# Patient Record
Sex: Female | Born: 1995 | Hispanic: Yes | Marital: Married | Smoking: Never smoker
Health system: Southern US, Community
[De-identification: ages and names within clinical notes are randomized; demographics above are authoritative.]

## PROBLEM LIST (undated history)

## (undated) ENCOUNTER — Inpatient Hospital Stay: Payer: Self-pay

## (undated) DIAGNOSIS — Z8759 Personal history of other complications of pregnancy, childbirth and the puerperium: Secondary | ICD-10-CM

## (undated) DIAGNOSIS — F419 Anxiety disorder, unspecified: Secondary | ICD-10-CM

## (undated) DIAGNOSIS — K625 Hemorrhage of anus and rectum: Secondary | ICD-10-CM

## (undated) DIAGNOSIS — Z87442 Personal history of urinary calculi: Secondary | ICD-10-CM

## (undated) HISTORY — DX: Hemorrhage of anus and rectum: K62.5

## (undated) HISTORY — DX: Personal history of urinary calculi: Z87.442

## (undated) HISTORY — DX: Personal history of urinary calculi: Z87.59

## (undated) HISTORY — DX: Anxiety disorder, unspecified: F41.9

## (undated) HISTORY — PX: NO PAST SURGERIES: SHX2092

---
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
## 2014-02-18 ENCOUNTER — Observation Stay: Payer: Self-pay | Admitting: Obstetrics and Gynecology

## 2014-03-09 ENCOUNTER — Inpatient Hospital Stay: Payer: Self-pay | Admitting: Obstetrics and Gynecology

## 2014-03-10 LAB — CBC WITH DIFFERENTIAL/PLATELET
Basophil #: 0 10*3/uL (ref 0.0–0.1)
Basophil %: 0.4 %
Eosinophil #: 0.1 10*3/uL (ref 0.0–0.7)
Eosinophil %: 0.6 %
HCT: 25.5 % — AB (ref 35.0–47.0)
HGB: 8.4 g/dL — ABNORMAL LOW (ref 12.0–16.0)
LYMPHS ABS: 1.7 10*3/uL (ref 1.0–3.6)
Lymphocyte %: 20.6 %
MCH: 29 pg (ref 26.0–34.0)
MCHC: 32.9 g/dL (ref 32.0–36.0)
MCV: 88 fL (ref 80–100)
MONO ABS: 0.7 x10 3/mm (ref 0.2–0.9)
Monocyte %: 7.7 %
NEUTROS ABS: 5.9 10*3/uL (ref 1.4–6.5)
Neutrophil %: 70.7 %
Platelet: 261 10*3/uL (ref 150–440)
RBC: 2.9 10*6/uL — ABNORMAL LOW (ref 3.80–5.20)
RDW: 13 % (ref 11.5–14.5)
WBC: 8.4 10*3/uL (ref 3.6–11.0)

## 2014-03-10 LAB — GC/CHLAMYDIA PROBE AMP

## 2014-03-11 LAB — HEMATOCRIT: HCT: 24.7 % — AB (ref 35.0–47.0)

## 2014-07-17 LAB — SURGICAL PATHOLOGY

## 2014-08-01 NOTE — H&P (Signed)
L&D Evaluation:  History:  HPI 19 yo G1P0, estimated date of confinement by U/S 03/27/2014, EGA 36.0 weeks, presents for IOL due to Intrahepatic Cholestasis of Pregnancy   Patient's Medical History Renal Lithiasis;   Patient's Surgical History none   Medications Pre Natal Vitamins  Actigall 300 mg tid   Allergies NKDA   Social History none   Family History Non-Contributory   ROS:  ROS All systems were reviewed.  HEENT, CNS, GI, GU, Respiratory, CV, Renal and Musculoskeletal systems were found to be normal.   Exam:  Vital Signs stable   General no apparent distress   Mental Status clear   Heart normal sinus rhythm   Abdomen gravid, tender with contractions   Estimated Fetal Weight Average for gestational age, 6#5   Back no CVAT   Edema no edema   Pelvic no external lesions, 4/80/-2/VTX/AROM-Clear/IUPC/FSE   FHT other, Episode of 5 min deceleration, resolved with SQ Terb x 1 dose; now with Mod variables with ctx's   Ucx regular, >200 monte Units   Skin dry   Lymph no lymphadenopathy   Other O+/ATB-/NR/RI/VI/HB-/GBS-/HIV-   Impression:  Impression 36.0 week  Intrauterine pregnancy with Cholestasis of Pregnancy; Cytotec IOL with Category 2 tracing.   Plan:  Plan monitor contractions and for cervical change   Comments Pt has been counseled re possible C/S delivery. She is understanding of all risks including but not limited to bleeding/infection/pelvic organ injury needing repair/ fetal injury/anesthesia risks/ blood clot disorders/etc. All questions are answered. Informed consent is given.   Electronic Signatures: Tashe Purdon, Alanda Slim (MD)  (Signed 18-Dec-15 10:10)  Authored: L&D Evaluation   Last Updated: 18-Dec-15 10:10 by Alyxis Grippi, Alanda Slim (MD)

## 2014-10-19 ENCOUNTER — Ambulatory Visit (INDEPENDENT_AMBULATORY_CARE_PROVIDER_SITE_OTHER): Payer: Medicaid Other | Admitting: Obstetrics and Gynecology

## 2014-10-19 ENCOUNTER — Encounter: Payer: Self-pay | Admitting: Obstetrics and Gynecology

## 2014-10-19 VITALS — BP 122/84 | HR 111 | Ht 66.0 in | Wt 127.2 lb

## 2014-10-19 DIAGNOSIS — O26619 Liver and biliary tract disorders in pregnancy, unspecified trimester: Secondary | ICD-10-CM | POA: Diagnosis not present

## 2014-10-19 DIAGNOSIS — O2240 Hemorrhoids in pregnancy, unspecified trimester: Secondary | ICD-10-CM | POA: Diagnosis not present

## 2014-10-19 DIAGNOSIS — K831 Obstruction of bile duct: Secondary | ICD-10-CM

## 2014-10-19 DIAGNOSIS — B3731 Acute candidiasis of vulva and vagina: Secondary | ICD-10-CM

## 2014-10-19 DIAGNOSIS — Z3041 Encounter for surveillance of contraceptive pills: Secondary | ICD-10-CM | POA: Diagnosis not present

## 2014-10-19 DIAGNOSIS — Z01419 Encounter for gynecological examination (general) (routine) without abnormal findings: Secondary | ICD-10-CM

## 2014-10-19 DIAGNOSIS — B373 Candidiasis of vulva and vagina: Secondary | ICD-10-CM | POA: Diagnosis not present

## 2014-10-19 DIAGNOSIS — Z0000 Encounter for general adult medical examination without abnormal findings: Secondary | ICD-10-CM | POA: Diagnosis not present

## 2014-10-19 DIAGNOSIS — N200 Calculus of kidney: Secondary | ICD-10-CM

## 2014-10-19 MED ORDER — NORETHINDRONE ACET-ETHINYL EST 1-20 MG-MCG PO TABS: 1.0000 | ORAL_TABLET | Freq: Every day | ORAL | Status: DC

## 2014-10-19 NOTE — Progress Notes (Signed)
Patient ID: Yvonne Morrison, female   DOB: 1995/07/16, 19 y.o.   MRN: [id number] ANNUAL PREVENTATIVE CARE GYN  ENCOUNTER NOTE  Subjective:       Yvonne Morrison is a 19 y.o. G62P1001 female here for a routine annual gynecologic exam.  Current complaints: 1.  Perimenstrual yeast infection. 2.  Transient left breast pain. 3.  Contraception-OCPs with irregular bleeding  Patient states that she is having vulvar itching just prior to and during each menses.  We discussed possible etiologies.  Fasting blood sugar will be obtained to assess this for glucose intolerance. Patient had episode of prickly left breast pain several days ago which resolved spontaneously. Patient is now on oral contraceptives and is having approximate 7 days of bleeding; she will bleed for several days, followed by no bleeding for a couple days, followed by additional couple days of spotting.  Total duration is 7 days.    Gynecologic History Patient's last menstrual period was 10/19/2014 (exact date). Contraception:Norethindrone Ace-Ethinyl Est Last Pap: 09/20/2013 negative Last mammogram: na  Obstetric History OB History  Gravida Para Term Preterm AB SAB TAB Ectopic Multiple Living  1 1 1       1     # Outcome Date GA Lbr Len/2nd Weight Sex Delivery Anes PTL Lv  1 Term 03/07/14   4 lb 1.9 oz (1.869 kg) F Vag-Spont   Y      Past Medical History  Diagnosis Date  . Maternal renal lithiasis, history of     g1  . Pelvic pressure in female   . Rectal bleeding   . Vaginal atrophy     Past Surgical History  Procedure Laterality Date  . No past surgeries      Current Outpatient Prescriptions on File Prior to Visit  Medication Sig Dispense Refill  . norethindrone-ethinyl estradiol (MICROGESTIN,JUNEL,LOESTRIN) 1-20 MG-MCG tablet Take 1 tablet by mouth daily.     No current facility-administered medications on file prior to visit.    Allergies no known allergies  History   Social History  . Marital Status: Married     Spouse Name: N/A  . Number of Children: N/A  . Years of Education: N/A   Occupational History  . Not on file.   Social History Main Topics  . Smoking status: Never Smoker   . Smokeless tobacco: Never Used  . Alcohol Use: No  . Drug Use: No  . Sexual Activity:    Partners: Male    Birth Control/ Protection: Pill   Other Topics Concern  . Not on file   Social History Narrative    Family History  Problem Relation Age of Onset  . Diabetes Father   . Prostate cancer Father   . Cancer Neg Hx   . Heart disease Neg Hx     The following portions of the patient's history were reviewed and updated as appropriate: allergies, current medications, past family history, past medical history, past social history, past surgical history and problem list.  Review of Systems ROS Review of Systems - General ROS: negative for - chills, fatigue, fever, hot flashes, night sweats, weight gain or weight loss Psychological ROS: negative for - anxiety, decreased libido, depression, mood swings, physical abuse or sexual abuse Ophthalmic ROS: negative for - blurry vision, eye pain or loss of vision ENT ROS: negative for - headaches, hearing change, visual changes or vocal changes Allergy and Immunology ROS: negative for - hives, itchy/watery eyes or seasonal allergies Hematological and Lymphatic ROS: negative for - bleeding  problems, bruising, swollen lymph nodes or weight loss Endocrine ROS: negative for - galactorrhea, hair pattern changes, hot flashes, malaise/lethargy, mood swings, palpitations, polydipsia/polyuria, skin changes, temperature intolerance or unexpected weight changes Breast ROS: negative for - new or changing breast lumps or nipple discharge Respiratory ROS: negative for - cough or shortness of breath Cardiovascular ROS: negative for - chest pain, irregular heartbeat, palpitations or shortness of breath Gastrointestinal ROS: no abdominal pain, change in bowel habits, or black or  bloody stools Genito-Urinary ROS: no dysuria, trouble voiding, or hematuria.  POSITIVE-perimenstrual vulvar itching Musculoskeletal ROS: negative for - joint pain or joint stiffness Neurological ROS: negative for - bowel and bladder control changes Dermatological ROS: negative for rash and skin lesion changes   Objective:   BP 122/84 mmHg  Pulse 111  Ht 5\' 6"  (1.676 m)  Wt 127 lb 4 oz (57.72 kg)  BMI 20.55 kg/m2  LMP 10/19/2014 (Exact Date)  Breastfeeding? No CONSTITUTIONAL: Well-developed, well-nourished female in no acute distress.  PSYCHIATRIC: Normal mood and affect. Normal behavior. Normal judgment and thought content. [place]: Alert and oriented to person, place, and time. Normal muscle tone coordination. No cranial nerve deficit noted. HENT:  Normocephalic, atraumatic, External right and left ear normal. Oropharynx is clear and moist EYES: Conjunctivae and EOM are normal. Pupils are equal, round, and reactive to light. No scleral icterus.  NECK: Normal range of motion, supple, no masses.  Normal thyroid.  SKIN: Skin is warm and dry. No rash noted. Not diaphoretic. No erythema. No pallor. CARDIOVASCULAR: Normal heart rate noted, regular rhythm, no murmur. RESPIRATORY: Clear to auscultation bilaterally. Effort and breath sounds normal, no problems with respiration noted. BREASTS: Symmetric in size. No masses, skin changes, nipple drainage, or lymphadenopathy. ABDOMEN: Soft, normal bowel sounds, no distention noted.  No tenderness, rebound or guarding.  BLADDER: Normal PELVIC:  External Genitalia: Normal  BUS: Normal  Vagina: Normal  Cervix: Normal  Uterus: Normal  Adnexa: Normal  RV: External Exam NormaI  MUSCULOSKELETAL: Normal range of motion. No tenderness.  No cyanosis, clubbing, or edema.  2+ distal pulses. LYMPHATIC: No Axillary, Supraclavicular, or Inguinal Adenopathy.    Assessment:   Annual gynecologic examination 19 y.o. Contraception: ocp's BMI:  20 perimenstrual vulvar itching, unclear etiology. Left breast pain, transient, without abnormal findings on exam  Plan:  Pap: due at age 19yo Mammogram: not indicated Stool Guaiac Testing:  Not indicated Labs: FBS;Hemoglobin A1c Routine preventative health maintenance measures emphasized: Exercise/Diet/Weight control, Tobacco Warnings, Alcohol/Substance use risks and Safe Sex Reassurance given regarding breast pain; normal exam noted. Menstrual calendar, monitoring to assess bleeding pattern Return to [place], LPN  Brayton Mars, MD

## 2014-10-20 ENCOUNTER — Other Ambulatory Visit: Payer: Medicaid Other

## 2014-10-20 DIAGNOSIS — O26619 Liver and biliary tract disorders in pregnancy, unspecified trimester: Secondary | ICD-10-CM

## 2014-10-20 DIAGNOSIS — O2240 Hemorrhoids in pregnancy, unspecified trimester: Secondary | ICD-10-CM | POA: Insufficient documentation

## 2014-10-20 DIAGNOSIS — N200 Calculus of kidney: Secondary | ICD-10-CM | POA: Insufficient documentation

## 2014-10-20 DIAGNOSIS — K831 Obstruction of bile duct: Secondary | ICD-10-CM | POA: Insufficient documentation

## 2014-10-20 DIAGNOSIS — B373 Candidiasis of vulva and vagina: Secondary | ICD-10-CM | POA: Insufficient documentation

## 2014-10-20 DIAGNOSIS — B3731 Acute candidiasis of vulva and vagina: Secondary | ICD-10-CM | POA: Insufficient documentation

## 2014-10-23 LAB — GLUCOSE, FASTING: Glucose, Plasma: 81 mg/dL (ref 65–99)

## 2014-10-23 LAB — HEMOGLOBIN A1C
ESTIMATED AVERAGE GLUCOSE: 105 mg/dL
Hgb A1c MFr Bld: 5.3 % (ref 4.8–5.6)

## 2015-03-25 NOTE — L&D Delivery Note (Signed)
Delivery Summary for Yvonne Morrison  Labor Events:   Preterm labor:   Rupture date:   Rupture time:   Rupture type: Spontaneous  Fluid Color: Clear  Induction:   Augmentation:   Complications:   Cervical ripening:          Delivery:   Episiotomy:   Lacerations:   Repair suture:   Repair # of packets:   Blood loss (ml): 100   Information for the patient's newborn:  [name], [name] [id number]    Delivery 01/10/2016 3:32 AM by  Vaginal, Spontaneous Delivery Sex:  female Gestational Age: [redacted]w[redacted]d Delivery Clinician:   Living?:         APGARS  One minute Five minutes Ten minutes  Skin color:        Heart rate:        Grimace:        Muscle tone:        Breathing:        Totals: 8  9      Presentation/position:      Resuscitation:   Cord information:    Disposition of cord blood:     Blood gases sent?  Complications:   Placenta: Delivered:       appearance Newborn Measurements: Weight: 5 lb 3.8 oz (2376 g)  Height: 19.09"  Head circumference:    Chest circumference:    Other providers:    Additional  information: Forceps:   Vacuum:   Breech:   Observed anomalies        Delivery Note At 3:32 AM a viable and healthy female was delivered via Vaginal, Spontaneous Delivery (Presentation: Vertex; ROA position).  APGAR: 8, 9; weight 5 lb 3.8 oz (2376 g).   Placenta status: spontaneously removed, intact.  Cord: 3-vessel, with the following complications: none.  Cord pH: not obtained  Anesthesia:   Episiotomy: None Lacerations: None Suture Repair: none Est. Blood Loss (mL): 100  Mom to postpartum.  Baby to Couplet care / Skin to Skin.    [name], MD [facility] 01/10/2016 3:52 AM

## 2015-05-31 ENCOUNTER — Encounter: Payer: Self-pay | Admitting: Obstetrics and Gynecology

## 2015-05-31 ENCOUNTER — Ambulatory Visit (INDEPENDENT_AMBULATORY_CARE_PROVIDER_SITE_OTHER): Payer: Medicaid Other | Admitting: Obstetrics and Gynecology

## 2015-05-31 VITALS — BP 124/68 | HR 112 | Ht 64.0 in | Wt 134.8 lb

## 2015-05-31 DIAGNOSIS — Z3490 Encounter for supervision of normal pregnancy, unspecified, unspecified trimester: Secondary | ICD-10-CM

## 2015-05-31 DIAGNOSIS — N912 Amenorrhea, unspecified: Secondary | ICD-10-CM | POA: Diagnosis not present

## 2015-05-31 DIAGNOSIS — Z331 Pregnant state, incidental: Secondary | ICD-10-CM | POA: Diagnosis not present

## 2015-05-31 LAB — POCT URINE PREGNANCY: PREG TEST UR: POSITIVE — AB

## 2015-05-31 NOTE — Progress Notes (Signed)
GYN ENCOUNTER NOTE  Subjective:       Yvonne Morrison is a 20 y.o. G22P1001 female is here for gynecologic evaluation of the following issues:  1. Confirmation of pregnancy.    Patient reports no significant vomiting. No significant breast tenderness. Bowel and bladder function are normal.  First pregnancy complicated by renal lithiasis and cholestasis of pregnancy at [redacted] weeks gestation   Gynecologic History Patient's last menstrual period was 04/24/2015 (exact date). Contraception: none Last Pap: none. Results were: N/A Last mammogram: none. Results were: N/A  Obstetric History OB History  Gravida Para Term Preterm AB SAB TAB Ectopic Multiple Living  2 1 1       1     # Outcome Date GA Lbr Len/2nd Weight Sex Delivery Anes PTL Lv  2 Current           1 Term 03/07/14   4 lb 1.9 oz (1.869 kg) F Vag-Spont   Y      Past Medical History  Diagnosis Date  . Maternal renal lithiasis, history of     g1  . Pelvic pressure in female   . Rectal bleeding   . Vaginal atrophy     Past Surgical History  Procedure Laterality Date  . No past surgeries      No current outpatient prescriptions on file prior to visit.   No current facility-administered medications on file prior to visit.    No Known Allergies  Social History   Social History  . Marital Status: Married    Spouse Name: N/A  . Number of Children: N/A  . Years of Education: N/A   Occupational History  . Not on file.   Social History Main Topics  . Smoking status: Never Smoker   . Smokeless tobacco: Never Used  . Alcohol Use: No  . Drug Use: No  . Sexual Activity:    Partners: Male    Birth Control/ Protection: None   Other Topics Concern  . Not on file   Social History Narrative    Family History  Problem Relation Age of Onset  . Diabetes Father   . Prostate cancer Father   . Cancer Neg Hx   . Heart disease Neg Hx     The following portions of the patient's history were reviewed and updated as  appropriate: allergies, current medications, past family history, past medical history, past social history, past surgical history and problem list.  Review of Systems Review of Systems - General ROS: negative for - chills, fatigue, fever, hot flashes, malaise or night sweats Hematological and Lymphatic ROS: negative for - bleeding problems or swollen lymph nodes Gastrointestinal ROS: negative for - abdominal pain, blood in stools, change in bowel habits and nausea/vomiting Musculoskeletal ROS: negative for - joint pain, muscle pain or muscular weakness Genito-Urinary ROS: negative for - change in menstrual cycle, dysmenorrhea, dyspareunia, dysuria, genital discharge, genital ulcers, hematuria, incontinence, irregular/heavy menses, nocturia or pelvic pain  Objective:   BP 124/68 mmHg  Pulse 112  Ht 5\' 4"  (1.626 m)  Wt 134 lb 12.8 oz (61.145 kg)  BMI 23.13 kg/m2  LMP 04/24/2015 (Exact Date)  Breastfeeding? No     Assessment:   1. Amenorrhea - POCT urine pregnancy 2. Pregnancy  LMP 04/24/2015  EDD 01/29/2016   EGA 5.2 weeks  3. History of cholestasis of pregnancy  4. History of renal lithiasis first pregnancy   Plan:   1. Return in 4 weeks for nursing intake 2. Return in 6 weeks  for new OB history and physical 3. Prenatal vitamins daily 4. New OB counseling:  The patient has been given an overview regarding routine prenatal care.  Recommendations regarding diet, weight gain, and exercise in pregnancy were given.  Prenatal testing, optional genetic testing, and ultrasound use in pregnancy were reviewed.   Benefits of Breast Feeding were discussed. The patient is encouraged to consider nursing her baby post partum.  A total of 25 minutes were spent face-to-face with the patient during this encounter and over half of that time involved counseling and coordination of care.   Olene Floss, PA-S Brayton Mars, MD   I have seen, interviewed, and examined the  patient in conjunction with the [facility].A. student and affirm the diagnosis and management plan. Martin A. DeFrancesco, MD, FACOG   Note: This dictation was prepared with Dragon dictation along with smaller phrase technology. Any transcriptional errors that result from this process are unintentional.

## 2015-06-01 ENCOUNTER — Encounter: Payer: Self-pay | Admitting: Obstetrics and Gynecology

## 2015-06-01 NOTE — Patient Instructions (Signed)
1. Prenatal vitamins daily 2. Return in 4 weeks for OB nursing intake 3. Return in 6 weeks for new OB history and physical

## 2015-06-22 IMAGING — US US OB LIMITED
1 series · 14 of 16 positions shown · non-contrast
Comparison: none

CLINICAL DATA: Severe right flank pain

EXAM:
LIMITED OBSTETRIC ULTRASOUND; RENAL ULTRASOUND

[Series 1: us ob limited · 0.21mm/px · 16 acquisitions, 14 frames shown]
[im 1/16]
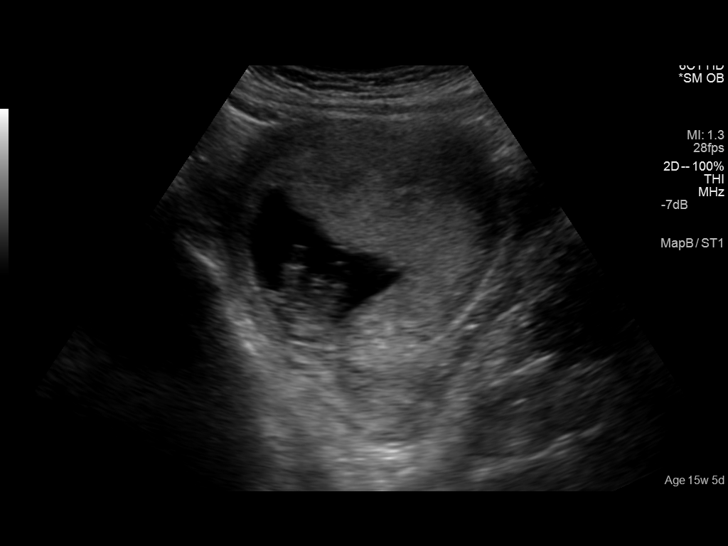
[im 2/16]
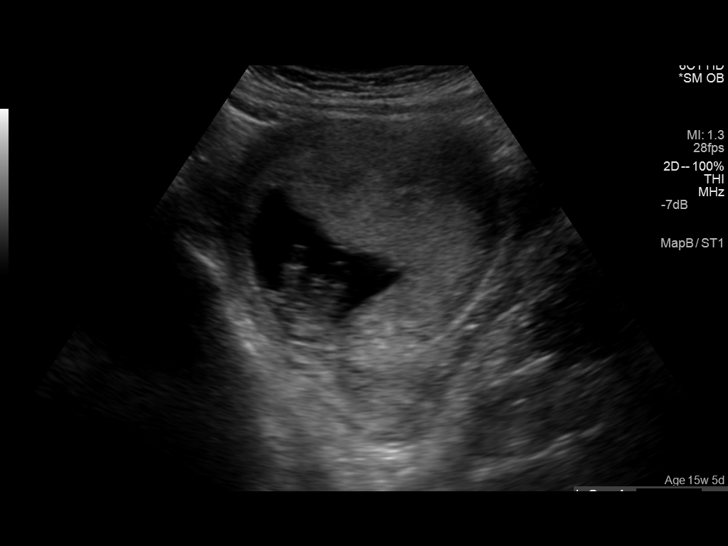
[im 3/16]
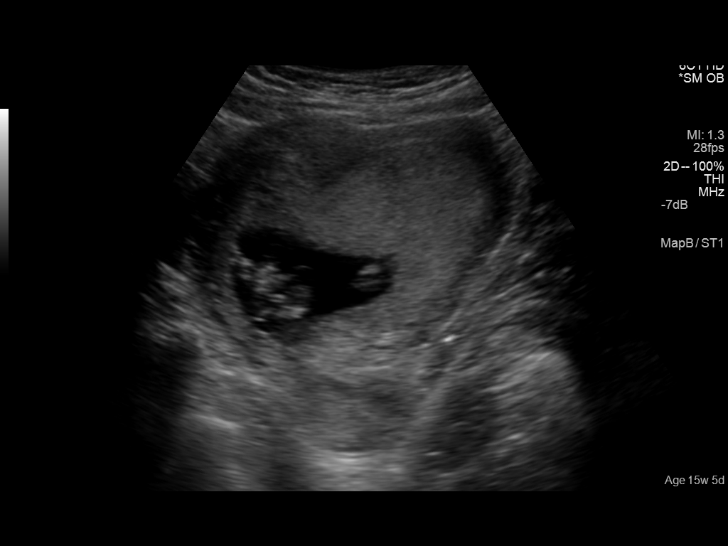
[im 5/16]
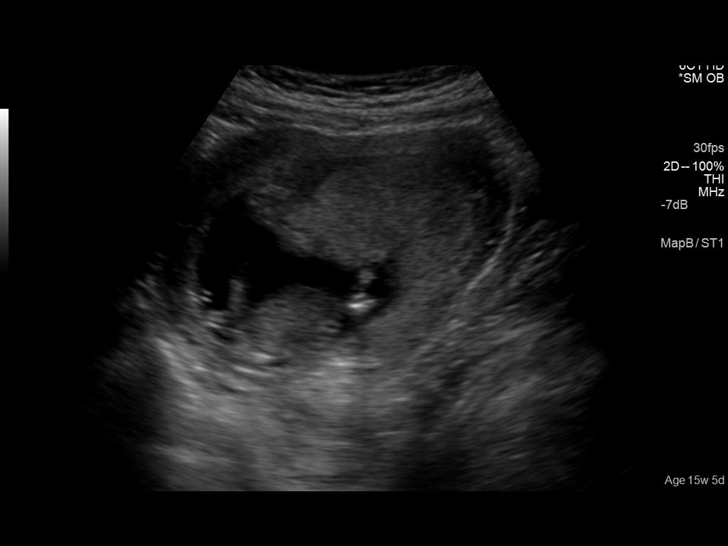
[im 6/16]
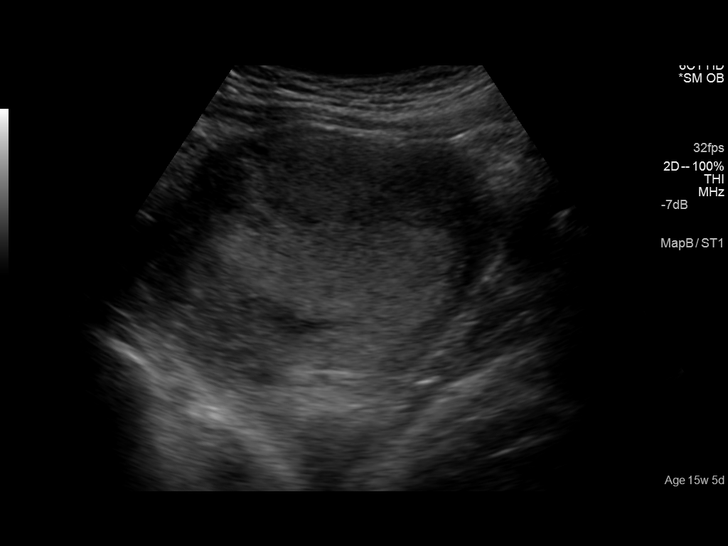
[im 7/16]
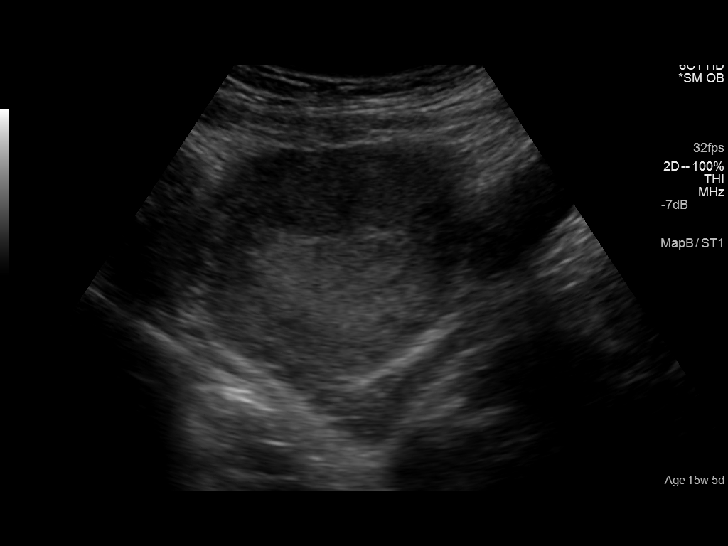
[im 8/16]
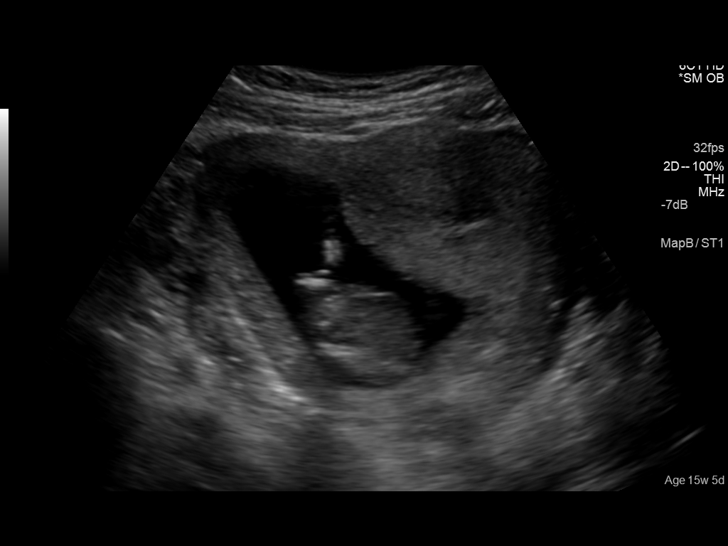
[im 9/16]
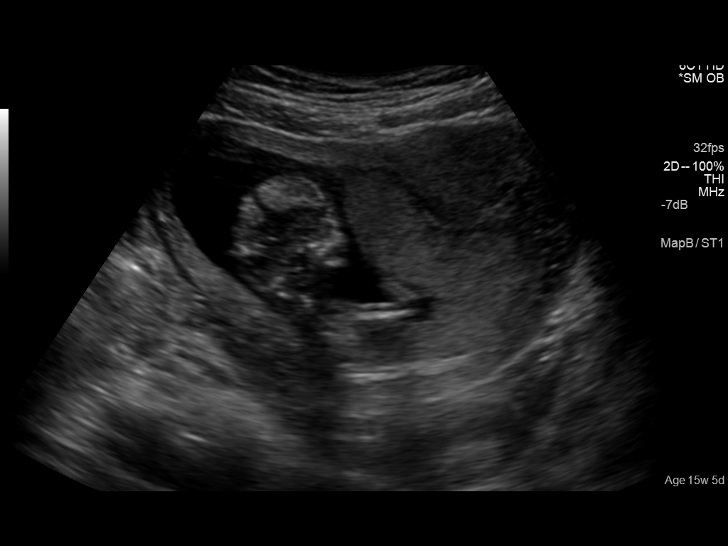
[im 10/16]
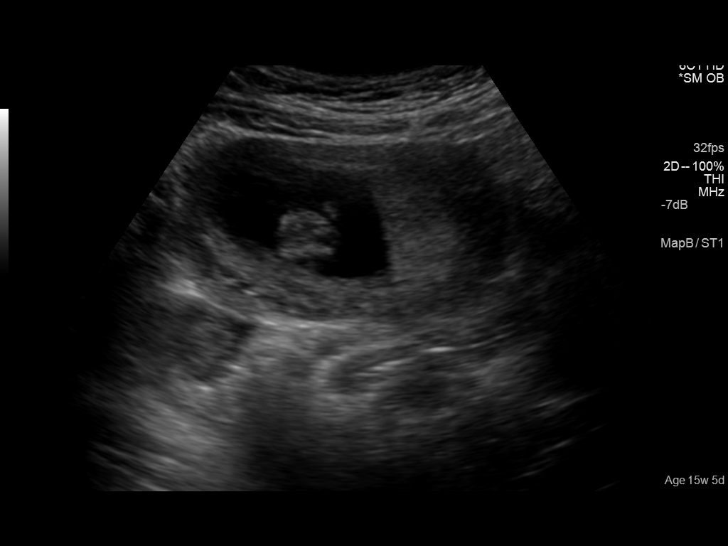
[im 11/16]
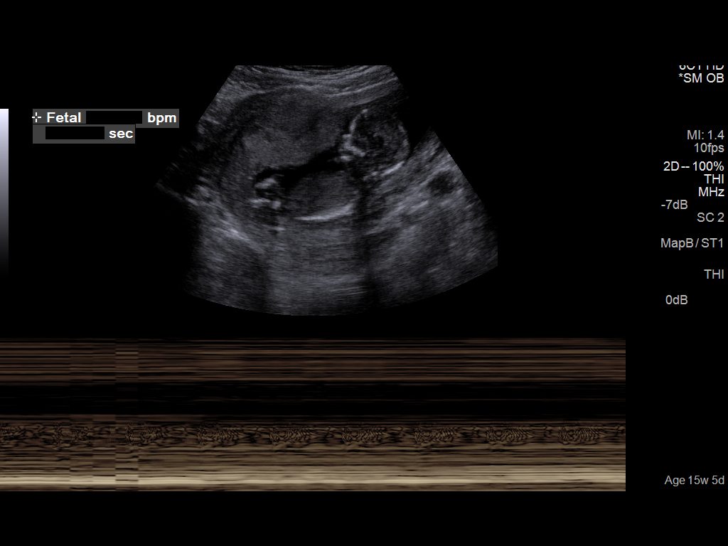
[im 13/16]
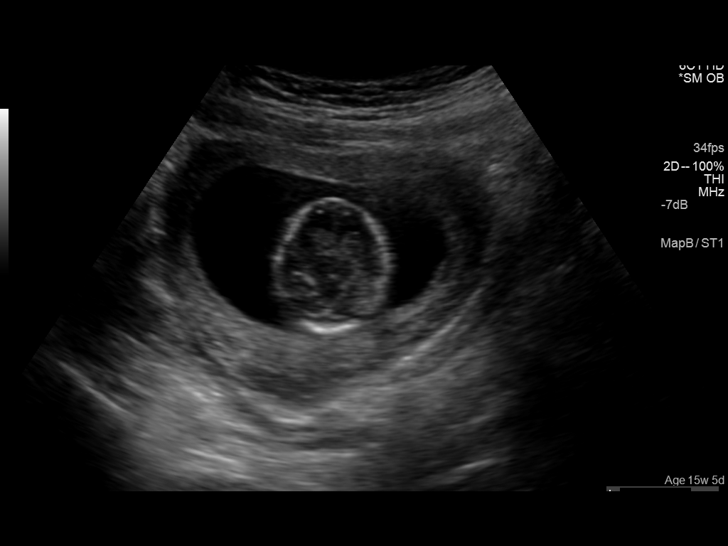
[im 14/16]
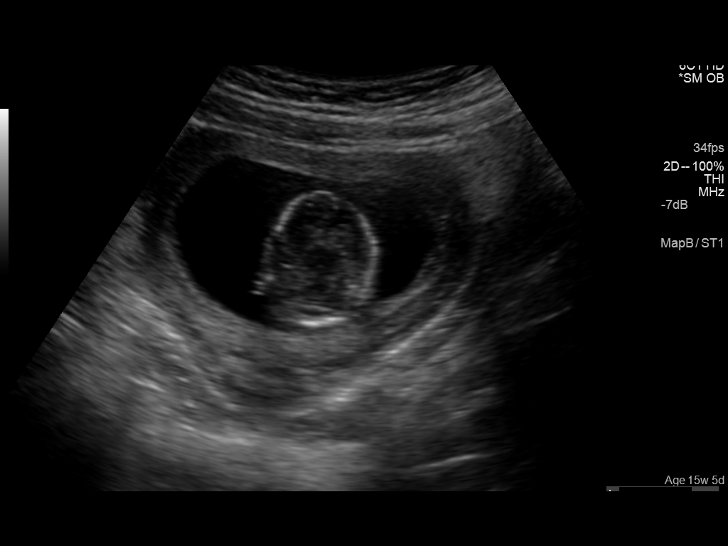
[im 15/16]
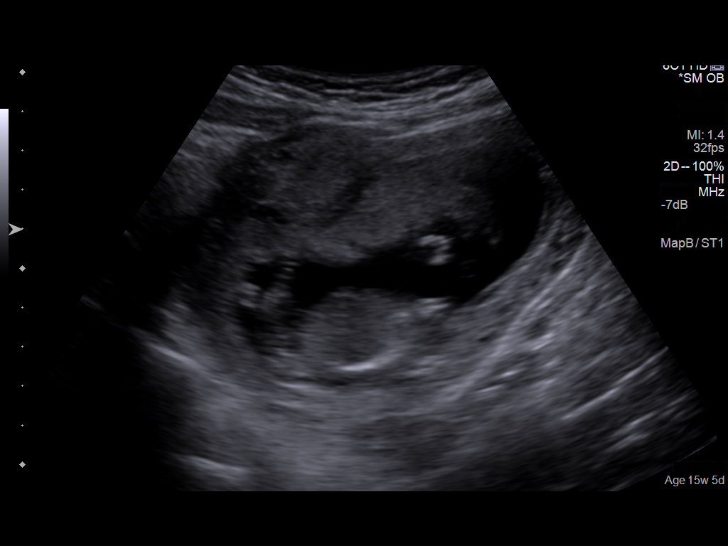
[im 16/16]
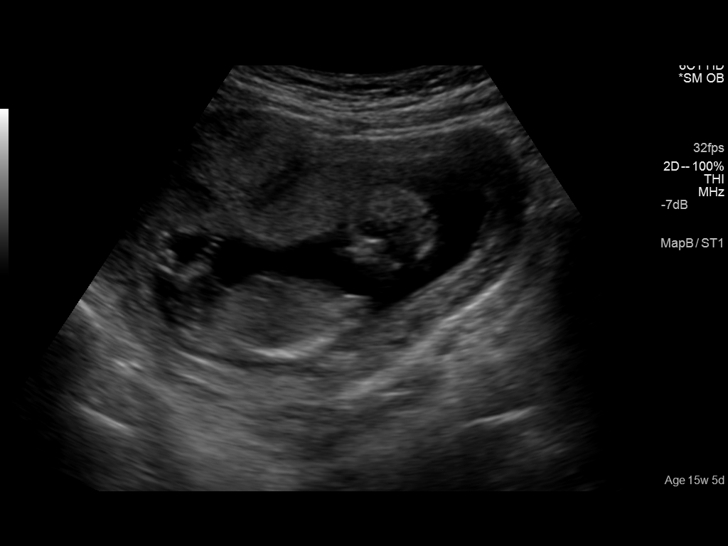

[14 of 16 positions shown; findings below may reference images not displayed]

FINDINGS: LIMITED OBSTETRIC ULTRASOUND

Number of Fetuses: 1

Heart Rate:  140 bpm

Movement: Yes

Presentation: Transverse

Placental Location: Right lateral

Previa: No

Amniotic Fluid (Subjective):  Within normal limits.

BPD:  2.61cm 14w  4d

MATERNAL FINDINGS:

Cervix:  Appears closed.

Uterus/Adnexae: No abnormality visualized. No images of the ovaries
provided.

RENAL ULTRASOUND

Right Kidney:

Length: 10.4 cm. Echogenicity within normal limits. Mild right
hydronephrosis. No nephrolithiasis. No mass visualized.

Left Kidney:

Length: 9.8 cm. Echogenicity within normal limits. No mass or
hydronephrosis visualized.

Bladder:

Decompressed bladder limiting evaluation.
IMPRESSION: 1. Single live intrauterine pregnancy dating 15 weeks 5 days.

This exam is performed on an emergent basis and does not
comprehensively evaluate fetal size, dating, or anatomy; follow-up
complete OB US should be considered if further fetal assessment is
warranted.

2.  Mild right hydronephrosis.

## 2015-06-29 ENCOUNTER — Ambulatory Visit (INDEPENDENT_AMBULATORY_CARE_PROVIDER_SITE_OTHER): Payer: Medicaid Other | Admitting: Obstetrics and Gynecology

## 2015-06-29 VITALS — BP 98/52 | HR 89 | Wt 133.4 lb

## 2015-06-29 DIAGNOSIS — Z113 Encounter for screening for infections with a predominantly sexual mode of transmission: Secondary | ICD-10-CM

## 2015-06-29 DIAGNOSIS — Z1389 Encounter for screening for other disorder: Secondary | ICD-10-CM

## 2015-06-29 DIAGNOSIS — Z36 Encounter for antenatal screening of mother: Secondary | ICD-10-CM

## 2015-06-29 DIAGNOSIS — Z3490 Encounter for supervision of normal pregnancy, unspecified, unspecified trimester: Secondary | ICD-10-CM

## 2015-06-29 DIAGNOSIS — Z369 Encounter for antenatal screening, unspecified: Secondary | ICD-10-CM

## 2015-06-29 DIAGNOSIS — Z331 Pregnant state, incidental: Secondary | ICD-10-CM

## 2015-06-29 NOTE — Patient Instructions (Signed)
Pregnancy and Zika Virus Disease Zika virus disease, or Zika, is an illness that can spread to people from mosquitoes that carry the virus. It may also spread from person to person through infected body fluids. Zika first occurred in [place], but recently it has spread to new areas. The virus occurs in tropical climates. The location of Zika continues to change. Most people who become infected with Zika virus do not develop serious illness. However, Zika may cause birth defects in an unborn baby whose mother is infected with the virus. It may also increase the risk of miscarriage. WHAT ARE THE SYMPTOMS OF ZIKA VIRUS DISEASE? In many cases, people who have been infected with Zika virus do not develop any symptoms. If symptoms appear, they usually start about a week after the person is infected. Symptoms are usually mild. They may include:  Fever.  Rash.  Red eyes.  Joint pain. HOW DOES ZIKA VIRUS DISEASE SPREAD? The main way that Zika virus spreads is through the bite of a certain type of mosquito. Unlike most types of mosquitos, which bite only at night, the type of mosquito that carries Zika virus bites both at night and during the day. Zika virus can also spread through sexual contact, through a blood transfusion, and from a mother to her baby before or during birth. Once you have had Zika virus disease, it is unlikely that you will get it again. CAN I PASS ZIKA TO MY BABY DURING PREGNANCY? Yes, Zika can pass from a mother to her baby before or during birth. WHAT PROBLEMS CAN ZIKA CAUSE FOR MY BABY? A woman who is infected with Zika virus while pregnant is at risk of having her baby born with a condition in which the brain or head is smaller than expected (microcephaly). Babies who have microcephaly can have developmental delays, seizures, hearing problems, and vision problems. Having Zika virus disease during pregnancy can also increase the risk of miscarriage. HOW CAN ZIKA VIRUS DISEASE BE  PREVENTED? There is no vaccine to prevent Zika. The best way to prevent the disease is to avoid infected mosquitoes and avoid exposure to body fluids that can spread the virus. Avoid any possible exposure to Zika by taking the following precautions. For women and their sex partners:  Avoid traveling to high-risk areas. The locations where Zika is being reported change often. To identify high-risk areas, check the CDC travel website: www.cdc.gov/zika/geo/index.html  If you or your sex partner must travel to a high-risk area, talk with a health care provider before and after traveling.  Take all precautions to avoid mosquito bites if you live in, or travel to, any of the high-risk areas. Insect repellents are safe to use during pregnancy.  Ask your health care provider when it is safe to have sexual contact. For women:  If you are pregnant or trying to become pregnant, avoid sexual contact with persons who may have been exposed to Zika virus, persons who have possible symptoms of Zika, or persons whose history you are unsure about. If you choose to have sexual contact with someone who may have been exposed to Zika virus, use condoms correctly during the entire duration of sexual activity, every time. Do not share sexual devices, as you may be exposed to body fluids.  Ask your health care provider about when it is safe to attempt pregnancy after a possible exposure to Zika virus. WHAT STEPS SHOULD I TAKE TO AVOID MOSQUITO BITES? Take these steps to avoid mosquito bites when you are   in a high-risk area:  Wear loose clothing that covers your arms and legs.  Limit your outdoor activities.  Do not open windows unless they have window screens.  Sleep under mosquito nets.  Use insect repellent. The best insect repellents have:  DEET, picaridin, oil of lemon eucalyptus (OLE), or IR3535 in them.  Higher amounts of an active ingredient in them.  Remember that insect repellents are safe to use  during pregnancy.  Do not use OLE on children who are younger than 3 years of age. Do not use insect repellent on babies who are younger than 2 months of age.  Cover your child's stroller with mosquito netting. Make sure the netting fits snugly and that any loose netting does not cover your child's mouth or nose. Do not use a blanket as a mosquito-protection cover.  Do not apply insect repellent underneath clothing.  If you are using sunscreen, apply the sunscreen before applying the insect repellent.  Treat clothing with permethrin. Do not apply permethrin directly to your skin. Follow label directions for safe use.  Get rid of standing water, where mosquitoes may reproduce. Standing water is often found in items such as buckets, bowls, animal food dishes, and flowerpots. When you return from traveling to any high-risk area, continue taking actions to protect yourself against mosquito bites for 3 weeks, even if you show no signs of illness. This will prevent spreading Zika virus to uninfected mosquitoes. WHAT SHOULD I KNOW ABOUT THE SEXUAL TRANSMISSION OF ZIKA? People can spread Zika to their sexual partners during vaginal, anal, or oral sex, or by sharing sexual devices. Many people with Zika do not develop symptoms, so a person could spread the disease without knowing that they are infected. The greatest risk is to women who are pregnant or who may become pregnant. Zika virus can live longer in semen than it can live in blood. Couples can prevent sexual transmission of the virus by:  Using condoms correctly during the entire duration of sexual activity, every time. This includes vaginal, anal, and oral sex.  Not sharing sexual devices. Sharing increases your risk of being exposed to body fluid from another person.  Avoiding all sexual activity until your health care provider says it is safe. SHOULD I BE TESTED FOR ZIKA VIRUS? A sample of your blood can be tested for Zika virus. A pregnant  woman should be tested if she may have been exposed to the virus or if she has symptoms of Zika. She may also have additional tests done during her pregnancy, such ultrasound testing. Talk with your health care provider about which tests are recommended.   This information is not intended to replace advice given to you by your health care provider. Make sure you discuss any questions you have with your health care provider.   Document Released: 11/29/2014 Document Reviewed: 11/22/2014 Elsevier Interactive Patient Education 2016 [employer] Minor Illnesses and Medications in Pregnancy  Cold/Flu:  Sudafed for congestion- Robitussin (plain) for cough- Tylenol for discomfort.  Please follow the directions on the label.  Try not to take any more than needed.  OTC Saline nasal spray and air humidifier or cool-mist  Vaporizer to sooth nasal irritation and to loosen congestion.  It is also important to increase intake of non carbonated fluids, especially if you have a fever.  Constipation:  Colace-2 capsules at bedtime; Metamucil- follow directions on label; Senokot- 1 tablet at bedtime.  Any one of these medications can be used.  It is also   very important to increase fluids and fruits along with regular exercise.  If problem persists please call the office.  Diarrhea:  Kaopectate as directed on the label.  Eat a bland diet and increase fluids.  Avoid highly seasoned foods.  Headache:  Tylenol 1 or 2 tablets every 3-4 hours as needed  Indigestion:  Maalox, Mylanta, Tums or Rolaids- as directed on label.  Also try to eat small meals and avoid fatty, greasy or spicy foods.  Nausea with or without Vomiting:  Nausea in pregnancy is caused by increased levels of hormones in the body which influence the digestive system and cause irritation when stomach acids accumulate.  Symptoms usually subside after 1st trimester of pregnancy.  Try the following:  Keep saltines, graham crackers or dry toast by your bed  to eat upon awakening.  Don't let your stomach get empty.  Try to eat 5-6 small meals per day instead of 3 large ones.  Avoid greasy fatty or highly seasoned foods.   Take OTC Unisom 1 tablet at bed time along with OTC Vitamin B6 25-50 mg 3 times per day.    If nausea continues with vomiting and you are unable to keep down food and fluids you may need a prescription medication.  Please notify your provider.   Sore throat:  Chloraseptic spray, throat lozenges and or plain Tylenol.  Vaginal Yeast Infection:  OTC Monistat for 7 days as directed on label.  If symptoms do not resolve within a week notify provider.  If any of the above problems do not subside with recommended treatment please call the office for further assistance.   Do not take Aspirin, Advil, Motrin or Ibuprofen.  * * OTC= Over the counter Hyperemesis Gravidarum Hyperemesis gravidarum is a severe form of nausea and vomiting that happens during pregnancy. Hyperemesis is worse than morning sickness. It may cause you to have nausea or vomiting all day for many days. It may keep you from eating and drinking enough food and liquids. Hyperemesis usually occurs during the first half (the first 20 weeks) of pregnancy. It often goes away once a woman is in her second half of pregnancy. However, sometimes hyperemesis continues through an entire pregnancy.  CAUSES  The cause of this condition is not completely known but is thought to be related to changes in the body's hormones when pregnant. It could be from the high level of the pregnancy hormone or an increase in estrogen in the body.  SIGNS AND SYMPTOMS   Severe nausea and vomiting.  Nausea that does not go away.  Vomiting that does not allow you to keep any food down.  Weight loss and body fluid loss (dehydration).  Having no desire to eat or not liking food you have previously enjoyed. DIAGNOSIS  Your health care provider will do a physical exam and ask you about your symptoms.  He or she may also order blood tests and urine tests to make sure something else is not causing the problem.  TREATMENT  You may only need medicine to control the problem. If medicines do not control the nausea and vomiting, you will be treated in the hospital to prevent dehydration, increased acid in the blood (acidosis), weight loss, and changes in the electrolytes in your body that may harm the unborn baby (fetus). You may need IV fluids.  HOME CARE INSTRUCTIONS   Only take over-the-counter or prescription medicines as directed by your health care provider.  Try eating a couple of dry crackers or   toast in the morning before getting out of bed.  Avoid foods and smells that upset your stomach.  Avoid fatty and spicy foods.  Eat 5-6 small meals a day.  Do not drink when eating meals. Drink between meals.  For snacks, eat high-protein foods, such as cheese.  Eat or suck on things that have ginger in them. Ginger helps nausea.  Avoid food preparation. The smell of food can spoil your appetite.  Avoid iron pills and iron in your multivitamins until after 3-4 months of being pregnant. However, consult with your health care provider before stopping any prescribed iron pills. SEEK MEDICAL CARE IF:   Your abdominal pain increases.  You have a severe headache.  You have vision problems.  You are losing weight. SEEK IMMEDIATE MEDICAL CARE IF:   You are unable to keep fluids down.  You vomit blood.  You have constant nausea and vomiting.  You have excessive weakness.  You have extreme thirst.  You have dizziness or fainting.  You have a fever or persistent symptoms for more than 2-3 days.  You have a fever and your symptoms suddenly get worse. MAKE SURE YOU:   Understand these instructions.  Will watch your condition.  Will get help right away if you are not doing well or get worse.   This information is not intended to replace advice given to you by your health care  provider. Make sure you discuss any questions you have with your health care provider.   Document Released: 03/10/2005 Document Revised: 12/29/2012 Document Reviewed: 10/20/2012 Elsevier Interactive Patient Education 2016 [employer] Commonly Asked Questions During Pregnancy  Cats: A parasite can be excreted in cat feces.  To avoid exposure you need to have another person empty the little box.  If you must empty the litter box you will need to wear gloves.  Wash your hands after handling your cat.  This parasite can also be found in raw or undercooked meat so this should also be avoided.  Colds, Sore Throats, Flu: Please check your medication sheet to see what you can take for symptoms.  If your symptoms are unrelieved by these medications please call the office.  Dental Work: Most any dental work your dentist recommends is permitted.  X-rays should only be taken during the first trimester if absolutely necessary.  Your abdomen should be shielded with a lead apron during all x-rays.  Please notify your provider prior to receiving any x-rays.  Novocaine is fine; gas is not recommended.  If your dentist requires a note from us prior to dental work please call the office and we will provide one for you.  Exercise: Exercise is an important part of staying healthy during your pregnancy.  You may continue most exercises you were accustomed to prior to pregnancy.  Later in your pregnancy you will most likely notice you have difficulty with activities requiring balance like riding a bicycle.  It is important that you listen to your body and avoid activities that put you at a higher risk of falling.  Adequate rest and staying well hydrated are a must!  If you have questions about the safety of specific activities ask your provider.    Exposure to Children with illness: Try to avoid obvious exposure; report any symptoms to us when noted,  If you have chicken pos, red measles or mumps, you should be immune to  these diseases.   Please do not take any vaccines while pregnant unless you have checked with   your OB provider.  Fetal Movement: After 28 weeks we recommend you do "kick counts" twice daily.  Lie or sit down in a calm quiet environment and count your baby movements "kicks".  You should feel your baby at least 10 times per hour.  If you have not felt 10 kicks within the first hour get up, walk around and have something sweet to eat or drink then repeat for an additional hour.  If count remains less than 10 per hour notify your provider.  Fumigating: Follow your pest control agent's advice as to how long to stay out of your home.  Ventilate the area well before re-entering.  Hemorrhoids:   Most over-the-counter preparations can be used during pregnancy.  Check your medication to see what is safe to use.  It is important to use a stool softener or fiber in your diet and to drink lots of liquids.  If hemorrhoids seem to be getting worse please call the office.   Hot Tubs:  Hot tubs Jacuzzis and saunas are not recommended while pregnant.  These increase your internal body temperature and should be avoided.  Intercourse:  Sexual intercourse is safe during pregnancy as long as you are comfortable, unless otherwise advised by your provider.  Spotting may occur after intercourse; report any bright red bleeding that is heavier than spotting.  Labor:  If you know that you are in labor, please go to the hospital.  If you are unsure, please call the office and let us help you decide what to do.  Lifting, straining, etc:  If your job requires heavy lifting or straining please check with your provider for any limitations.  Generally, you should not lift items heavier than that you can lift simply with your hands and arms (no back muscles)  Painting:  Paint fumes do not harm your pregnancy, but may make you ill and should be avoided if possible.  Latex or water based paints have less odor than oils.  Use adequate  ventilation while painting.  Permanents & Hair Color:  Chemicals in hair dyes are not recommended as they cause increase hair dryness which can increase hair loss during pregnancy.  " Highlighting" and permanents are allowed.  Dye may be absorbed differently and permanents may not hold as well during pregnancy.  Sunbathing:  Use a sunscreen, as skin burns easily during pregnancy.  Drink plenty of fluids; avoid over heating.  Tanning Beds:  Because their possible side effects are still unknown, tanning beds are not recommended.  Ultrasound Scans:  Routine ultrasounds are performed at approximately 20 weeks.  You will be able to see your baby's general anatomy an if you would like to know the gender this can usually be determined as well.  If it is questionable when you conceived you may also receive an ultrasound early in your pregnancy for dating purposes.  Otherwise ultrasound exams are not routinely performed unless there is a medical necessity.  Although you can request a scan we ask that you pay for it when conducted because insurance does not cover " patient request" scans.  Work: If your pregnancy proceeds without complications you may work until your due date, unless your physician or employer advises otherwise.  Round Ligament Pain/Pelvic Discomfort:  Sharp, shooting pains not associated with bleeding are fairly common, usually occurring in the second trimester of pregnancy.  They tend to be worse when standing up or when you remain standing for long periods of time.  These are the result   of pressure of certain pelvic ligaments called "round ligaments".  Rest, Tylenol and heat seem to be the most effective relief.  As the womb and fetus grow, they rise out of the pelvis and the discomfort improves.  Please notify the office if your pain seems different than that described.  It may represent a more serious condition.   

## 2015-06-29 NOTE — Progress Notes (Signed)
Yvonne Morrison presents for NOB nurse interview visit. G-2.  P-1001. Confirmation of pregnancy by Dr. Enzo Bi on 05/31/2015.  Pregnancy education material explained and given.  No cats in the home. NOB labs ordered.  HIV labs and Drug screen were explained optional and she could opt out of tests but did not decline. Drug screen ordered. PNV encouraged. NT /to discuss with provider. Pt. To follow up with provider as scheduled on 07/12/2015  for NOB physical.  Pt advised that she may take B6 25-50mg . 3xd and unisom at bedtime for nausea. If this does not help to contact office. All questions answered.

## 2015-06-30 LAB — GC/CHLAMYDIA PROBE AMP
CHLAMYDIA, DNA PROBE: NEGATIVE
NEISSERIA GONORRHOEAE BY PCR: NEGATIVE

## 2015-07-01 LAB — URINALYSIS, ROUTINE W REFLEX MICROSCOPIC
Bilirubin, UA: NEGATIVE
Glucose, UA: NEGATIVE
KETONES UA: NEGATIVE
LEUKOCYTES UA: NEGATIVE
Nitrite, UA: NEGATIVE
PH UA: 6.5 (ref 5.0–7.5)
RBC, UA: NEGATIVE
SPEC GRAV UA: 1.026 (ref 1.005–1.030)
Urobilinogen, Ur: 0.2 mg/dL (ref 0.2–1.0)

## 2015-07-01 LAB — MICROSCOPIC EXAMINATION: Casts: NONE SEEN /lpf

## 2015-07-01 LAB — PAIN MGT SCRN (14 DRUGS), UR
Amphetamine Screen, Ur: NEGATIVE ng/mL
BARBITURATE SCRN UR: NEGATIVE ng/mL
BUPRENORPHINE, URINE: NEGATIVE ng/mL
Benzodiazepine Screen, Urine: NEGATIVE ng/mL
CREATININE(CRT), U: 246.4 mg/dL (ref 20.0–300.0)
Cannabinoids Ur Ql Scn: NEGATIVE ng/mL
Cocaine(Metab.)Screen, Urine: NEGATIVE ng/mL
FENTANYL, URINE: NEGATIVE pg/mL
MEPERIDINE SCREEN, URINE: NEGATIVE ng/mL
METHADONE SCREEN, URINE: NEGATIVE ng/mL
OPIATE SCRN UR: NEGATIVE ng/mL
OXYCODONE+OXYMORPHONE UR QL SCN: NEGATIVE ng/mL
PCP SCRN UR: NEGATIVE ng/mL
Ph of Urine: 6.4 (ref 4.5–8.9)
Propoxyphene, Screen: NEGATIVE ng/mL
TRAMADOL UR QL SCN: NEGATIVE ng/mL

## 2015-07-01 LAB — URINE CULTURE, OB REFLEX

## 2015-07-01 LAB — NICOTINE SCREEN, URINE: COTININE UR QL SCN: NEGATIVE ng/mL

## 2015-07-01 LAB — CULTURE, OB URINE

## 2015-07-03 LAB — ABO

## 2015-07-03 LAB — CBC WITH DIFFERENTIAL/PLATELET
BASOS ABS: 0 10*3/uL (ref 0.0–0.2)
Basos: 0 %
EOS (ABSOLUTE): 0 10*3/uL (ref 0.0–0.4)
EOS: 1 %
HEMATOCRIT: 36.5 % (ref 34.0–46.6)
HEMOGLOBIN: 11.9 g/dL (ref 11.1–15.9)
IMMATURE GRANS (ABS): 0 10*3/uL (ref 0.0–0.1)
IMMATURE GRANULOCYTES: 0 %
LYMPHS ABS: 1.4 10*3/uL (ref 0.7–3.1)
LYMPHS: 27 %
MCH: 29.1 pg (ref 26.6–33.0)
MCHC: 32.6 g/dL (ref 31.5–35.7)
MCV: 89 fL (ref 79–97)
MONOCYTES: 7 %
Monocytes Absolute: 0.4 10*3/uL (ref 0.1–0.9)
Neutrophils Absolute: 3.4 10*3/uL (ref 1.4–7.0)
Neutrophils: 65 %
Platelets: 203 10*3/uL (ref 150–379)
RBC: 4.09 x10E6/uL (ref 3.77–5.28)
RDW: 14.3 % (ref 12.3–15.4)
WBC: 5.2 10*3/uL (ref 3.4–10.8)

## 2015-07-03 LAB — ANTIBODY SCREEN: ANTIBODY SCREEN: NEGATIVE

## 2015-07-03 LAB — RPR: RPR Ser Ql: NONREACTIVE

## 2015-07-03 LAB — VARICELLA ZOSTER ANTIBODY, IGM: Varicella IgM: 1.05 index — ABNORMAL HIGH (ref 0.00–0.90)

## 2015-07-03 LAB — HEPATITIS B SURFACE ANTIGEN: HEP B S AG: NEGATIVE

## 2015-07-03 LAB — HIV ANTIBODY (ROUTINE TESTING W REFLEX): HIV SCREEN 4TH GENERATION: NONREACTIVE

## 2015-07-03 LAB — RUBELLA ANTIBODY, IGM: Rubella IgM: <20 AU/mL (ref 0.0–19.9)

## 2015-07-03 LAB — RH TYPE: RH TYPE: POSITIVE

## 2015-07-12 ENCOUNTER — Ambulatory Visit (INDEPENDENT_AMBULATORY_CARE_PROVIDER_SITE_OTHER): Payer: Medicaid Other | Admitting: Obstetrics and Gynecology

## 2015-07-12 ENCOUNTER — Other Ambulatory Visit: Payer: Self-pay | Admitting: Obstetrics and Gynecology

## 2015-07-12 ENCOUNTER — Ambulatory Visit (INDEPENDENT_AMBULATORY_CARE_PROVIDER_SITE_OTHER): Payer: Medicaid Other

## 2015-07-12 VITALS — BP 103/68 | HR 87 | Wt 132.2 lb

## 2015-07-12 DIAGNOSIS — Z3682 Encounter for antenatal screening for nuchal translucency: Secondary | ICD-10-CM

## 2015-07-12 DIAGNOSIS — Z3491 Encounter for supervision of normal pregnancy, unspecified, first trimester: Secondary | ICD-10-CM | POA: Diagnosis not present

## 2015-07-12 DIAGNOSIS — Z87442 Personal history of urinary calculi: Secondary | ICD-10-CM

## 2015-07-12 DIAGNOSIS — Z36 Encounter for antenatal screening of mother: Secondary | ICD-10-CM

## 2015-07-12 DIAGNOSIS — Z8759 Personal history of other complications of pregnancy, childbirth and the puerperium: Secondary | ICD-10-CM

## 2015-07-12 DIAGNOSIS — O26619 Liver and biliary tract disorders in pregnancy, unspecified trimester: Secondary | ICD-10-CM

## 2015-07-12 DIAGNOSIS — Z124 Encounter for screening for malignant neoplasm of cervix: Secondary | ICD-10-CM

## 2015-07-12 DIAGNOSIS — K831 Obstruction of bile duct: Secondary | ICD-10-CM

## 2015-07-12 LAB — POCT URINALYSIS DIPSTICK
Bilirubin, UA: NEGATIVE
GLUCOSE UA: NEGATIVE
KETONES UA: NEGATIVE
LEUKOCYTES UA: NEGATIVE
Nitrite, UA: NEGATIVE
PROTEIN UA: NEGATIVE
Spec Grav, UA: 1.015
UROBILINOGEN UA: NEGATIVE
pH, UA: 6.5

## 2015-07-12 NOTE — Progress Notes (Signed)
OBSTETRIC INITIAL PRENATAL VISIT  Subjective:    Yvonne Morrison is being seen today for her first obstetrical visit.  This is a planned pregnancy. She is a 20 y.o. G2P1001 female at [redacted]w[redacted]d gestation, Estimated Date of Delivery: 02/07/16 by today's ultrasound,  with last menstrual period 04/24/2015 (inconsistent with ultrasound). Her obstetrical history is significant for cholestasis of pregnancy, requiring delivery at 36 weeks, and kidney stones. Relationship with FOB: spouse, living together. Patient does intend to breast feed. Pregnancy history fully reviewed.    Obstetric History   G2   P1   T0   P1   A0   TAB0   SAB0   E0   M0   L1     # Outcome Date GA Lbr Len/2nd Weight Sex Delivery Anes PTL Lv  2 Current           1 Preterm 03/07/14 [redacted]w[redacted]d  4 lb 1.9 oz (1.869 kg) F Vag-Spont   Y    Obstetric Comments  History of cholestasis of pregnancy at 28 weeks  History of renal lithiasis    Gynecologic History:  Last pap smear was 06/2014.  Results were normal.  Denies h/o abnormal pap smears in the past.  Denies history of STIs.    Past Medical History  Diagnosis Date  . Maternal renal lithiasis, history of 2015    1st pregnancy  . Rectal bleeding     Family History  Problem Relation Age of Onset  . Diabetes Father   . Prostate cancer Father   . Cancer Neg Hx   . Heart disease Neg Hx     Past Surgical History  Procedure Laterality Date  . No past surgeries      Social History   Social History  . Marital Status: Married    Spouse Name: N/A  . Number of Children: N/A  . Years of Education: N/A   Occupational History  . Not on file.   Social History Main Topics  . Smoking status: Never Smoker   . Smokeless tobacco: Never Used  . Alcohol Use: No  . Drug Use: No  . Sexual Activity:    Partners: Male    Birth Control/ Protection: None   Other Topics Concern  . Not on file   Social History Narrative    Current Outpatient Prescriptions on File Prior to Visit    Medication Sig Dispense Refill  . Prenatal Vit-Fe Fumarate-FA (PRENATAL VITAMINS) 28-0.8 MG TABS Take by mouth.     No current facility-administered medications on file prior to visit.    No Known Allergies   Review of Systems General:Not Present- Fever, Weight Loss and Weight Gain. Skin:Not Present- Rash. HEENT:Not Present- Blurred Vision, Headache and Bleeding Gums. Respiratory:Not Present- Difficulty Breathing. Breast:Not Present- Breast Mass. Cardiovascular:Not Present- Chest Pain, Elevated Blood Pressure, Fainting / Blacking Out and Shortness of Breath. Gastrointestinal:Not Present- Abdominal Pain, Constipation, Nausea and Vomiting. Female Genitourinary:Not Present- Frequency, Painful Urination, Pelvic Pain, Vaginal Bleeding, Vaginal Discharge, Contractions, regular, Fetal Movements Decreased, Urinary Complaints and Vaginal Fluid. Musculoskeletal:Not Present- Back Pain and Leg Cramps. Neurological:Not Present- Dizziness. Psychiatric:Not Present- Depression.     Objective:   Blood pressure 103/68, pulse 87, weight 132 lb 3.2 oz (59.966 kg), last menstrual period 04/24/2015, not currently breastfeeding.  Body mass index is 22.68 kg/(m^2).   General Appearance:    Alert, cooperative, no distress, appears stated age  Head:    Normocephalic, without obvious abnormality, atraumatic  Eyes:    PERRL, conjunctiva/corneas clear,  EOM's intact, both eyes  Ears:    Normal external ear canals, both ears  Nose:   Nares normal, septum midline, mucosa normal, no drainage or sinus tenderness  Throat:   Lips, mucosa, and tongue normal; teeth and gums normal  Neck:   Supple, symmetrical, trachea midline, no adenopathy; thyroid: no enlargement/tenderness/nodules; no carotid bruit or JVD  Back:     Symmetric, no curvature, ROM normal, no CVA tenderness  Lungs:     Clear to auscultation bilaterally, respirations unlabored  Chest Wall:    No tenderness or deformity   Heart:    Regular  rate and rhythm, S1 and S2 normal, no murmur, rub or gallop  Breast Exam:    No tenderness, masses, or nipple abnormality.  Fibrocystic changes present.   Abdomen:     Soft, non-tender, bowel sounds active all four quadrants, no masses, no organomegaly.  FHT 157 bpm (by ultrasound) .  Genitalia:    Pelvic:external genitalia normal, vagina without lesions, discharge, or tenderness, rectovaginal septum  normal. Cervix normal in appearance, no cervical motion tenderness, no adnexal masses or tenderness.  Pregnancy positive findings: uterine enlargement: 10-11 wk size, nontender.   Rectal:    Normal external sphincter.  No hemorrhoids appreciated. Internal exam not done.   Extremities:   Extremities normal, atraumatic, no cyanosis or edema  Pulses:   2+ and symmetric all extremities  Skin:   Skin color, texture, turgor normal, no rashes or lesions  Lymph nodes:   Cervical, supraclavicular, and axillary nodes normal  Neurologic:   CNII-XII intact, normal strength, sensation and reflexes throughout    Assessment:    Pregnancy at 10 and 0/7 weeks (dated by today's ultrasound), inconsistent with LMP  H/o cholestasis of pregnancy in prior pregnancy  H/o renal stones (identified during last pregnancy)   Plan:    Initial labs reviewed. Pap smear up to date. Prenatal vitamins encouraged.  Patient unable to tolerate previous vitamins prescribed as well as gummies.  Can take regular multi-vitamin.  Should take 2 adult vitamins, or take 1 vitamin with supplemental folic acid.  Problem list reviewed and updated. New OB counseling:  The patient has been given an overview regarding routine prenatal care.  Recommendations regarding diet, weight gain, and exercise in pregnancy were given. Prenatal testing, optional genetic testing, and ultrasound use in pregnancy were reviewed.  AFP3 discussed: ordered (attempted to perform today, however EDD was inconsistent).  Will have patient return in 1-2 weeks. Benefits  of Breast Feeding were discussed. The patient is encouraged to consider nursing her baby post partum. Discussed cholestasis in pregnancy and chances of recurrence in second pregnancy, as well as monitoring in current pregnancy.  Discussed renal stones in pregnancy.  Currently asymptomatic. No intervention required currently.  Follow up in 4 weeks.  50% of 30 min visit spent on counseling and coordination of care.     [name], MD [facility]

## 2015-07-15 ENCOUNTER — Encounter: Payer: Self-pay | Admitting: Obstetrics and Gynecology

## 2015-07-15 DIAGNOSIS — Z87442 Personal history of urinary calculi: Secondary | ICD-10-CM | POA: Insufficient documentation

## 2015-07-15 DIAGNOSIS — Z8759 Personal history of other complications of pregnancy, childbirth and the puerperium: Secondary | ICD-10-CM

## 2015-07-15 LAB — PAP IG, CT-NG, RFX HPV ASCU
CHLAMYDIA, NUC. ACID AMP: NEGATIVE
GONOCOCCUS BY NUCLEIC ACID AMP: NEGATIVE
PAP SMEAR COMMENT: 0

## 2015-07-18 ENCOUNTER — Other Ambulatory Visit: Payer: Self-pay | Admitting: Obstetrics and Gynecology

## 2015-07-18 DIAGNOSIS — Z3682 Encounter for antenatal screening for nuchal translucency: Secondary | ICD-10-CM

## 2015-07-26 ENCOUNTER — Ambulatory Visit (INDEPENDENT_AMBULATORY_CARE_PROVIDER_SITE_OTHER): Payer: Medicaid Other

## 2015-07-26 ENCOUNTER — Telehealth: Payer: Self-pay | Admitting: Obstetrics and Gynecology

## 2015-07-26 DIAGNOSIS — Z3682 Encounter for antenatal screening for nuchal translucency: Secondary | ICD-10-CM

## 2015-07-26 DIAGNOSIS — O283 Abnormal ultrasonic finding on antenatal screening of mother: Secondary | ICD-10-CM | POA: Diagnosis not present

## 2015-07-26 DIAGNOSIS — Z3492 Encounter for supervision of normal pregnancy, unspecified, second trimester: Secondary | ICD-10-CM

## 2015-07-26 MED ORDER — PROVIDA OB 20-20-1.25 MG PO CAPS: 20.0000 mg | ORAL_CAPSULE | Freq: Every day | ORAL | Status: DC

## 2015-07-26 NOTE — Telephone Encounter (Signed)
Patient would like a script for the provida ob vitamins sent to her pharmacy. Thanks

## 2015-07-26 NOTE — Telephone Encounter (Signed)
Done

## 2015-07-27 ENCOUNTER — Other Ambulatory Visit: Payer: Self-pay | Admitting: Obstetrics and Gynecology

## 2015-07-31 ENCOUNTER — Telehealth: Payer: Self-pay

## 2015-07-31 LAB — FIRST TRIMESTER SCREEN W/NT
CRL: 60 mm
DIA MOM: 0.87
DIA Value: 235.1 pg/mL
Gest Age-Collect: 12.6 weeks
Maternal Age At EDD: 21.7 years
NUCHAL TRANSLUCENCY MOM: 0.91
NUCHAL TRANSLUCENCY: 1.4 mm
NUMBER OF FETUSES: 1
PAPP-A MoM: 0.29
PAPP-A Value: 332.7 ng/mL
TEST RESULTS: NEGATIVE
WEIGHT: 132 [lb_av]
hCG MoM: 0.74
hCG Value: 71.4 IU/mL

## 2015-07-31 NOTE — Telephone Encounter (Signed)
Called pt, no answer. Unable to leave message as no voicemail is set up.  

## 2015-07-31 NOTE — Telephone Encounter (Signed)
-----   Message from [name], MD sent at 07/31/2015  9:09 AM EDT ----- Please inform of normal 1st trimester screen.

## 2015-08-09 ENCOUNTER — Ambulatory Visit (INDEPENDENT_AMBULATORY_CARE_PROVIDER_SITE_OTHER): Payer: Medicaid Other | Admitting: Obstetrics and Gynecology

## 2015-08-09 VITALS — BP 100/58 | HR 99 | Wt 130.0 lb

## 2015-08-09 DIAGNOSIS — R040 Epistaxis: Secondary | ICD-10-CM

## 2015-08-09 DIAGNOSIS — Z3490 Encounter for supervision of normal pregnancy, unspecified, unspecified trimester: Secondary | ICD-10-CM | POA: Insufficient documentation

## 2015-08-09 DIAGNOSIS — R42 Dizziness and giddiness: Secondary | ICD-10-CM

## 2015-08-09 DIAGNOSIS — Z3482 Encounter for supervision of other normal pregnancy, second trimester: Secondary | ICD-10-CM

## 2015-08-09 DIAGNOSIS — Z3492 Encounter for supervision of normal pregnancy, unspecified, second trimester: Secondary | ICD-10-CM

## 2015-08-09 LAB — POCT URINALYSIS DIPSTICK
BILIRUBIN UA: NEGATIVE
GLUCOSE UA: NEGATIVE
Ketones, UA: NEGATIVE
Nitrite, UA: NEGATIVE
Protein, UA: NEGATIVE
SPEC GRAV UA: 1.02
UROBILINOGEN UA: NEGATIVE
pH, UA: 6.5

## 2015-08-09 NOTE — Progress Notes (Signed)
ROB: Patient c/o nose bleeds, occasional dizziness.  Discussed adequate hydration, nasal saline spray for nostrils.  Pregnancy home medicaid form completed.  Down 2 lbs, denies vomiting or decreased appetite. EDD corrected by ultrasound (was inconsistent with dates). 1st trimester screen normal. RTC in 4 weeks.

## 2015-08-29 ENCOUNTER — Telehealth: Payer: Self-pay | Admitting: Obstetrics and Gynecology

## 2015-08-29 DIAGNOSIS — B373 Candidiasis of vulva and vagina: Secondary | ICD-10-CM

## 2015-08-29 DIAGNOSIS — B3731 Acute candidiasis of vulva and vagina: Secondary | ICD-10-CM

## 2015-08-29 MED ORDER — FLUCONAZOLE 150 MG PO TABS: 150.0000 mg | ORAL_TABLET | Freq: Once | ORAL | Status: DC

## 2015-08-29 NOTE — Telephone Encounter (Signed)
Called pt, she has signs and sx of a vaginal yeast infection. Sent in rx for diflucan. Pt to call back if no improvement in 7 days.

## 2015-08-29 NOTE — Telephone Encounter (Signed)
SHE IS HAVING SYMPTOMS OF A YEAST INF/ ITCHY REDNESS, YELLOW DISCHARGE/ [redacted] WKS PREGNANT

## 2015-09-06 ENCOUNTER — Ambulatory Visit (INDEPENDENT_AMBULATORY_CARE_PROVIDER_SITE_OTHER): Payer: Medicaid Other | Admitting: Obstetrics and Gynecology

## 2015-09-06 VITALS — BP 97/57 | HR 94 | Wt 132.9 lb

## 2015-09-06 DIAGNOSIS — Z3482 Encounter for supervision of other normal pregnancy, second trimester: Secondary | ICD-10-CM

## 2015-09-06 DIAGNOSIS — F419 Anxiety disorder, unspecified: Secondary | ICD-10-CM

## 2015-09-06 DIAGNOSIS — O26899 Other specified pregnancy related conditions, unspecified trimester: Secondary | ICD-10-CM

## 2015-09-06 DIAGNOSIS — F410 Panic disorder [episodic paroxysmal anxiety] without agoraphobia: Secondary | ICD-10-CM

## 2015-09-06 DIAGNOSIS — F430 Acute stress reaction: Secondary | ICD-10-CM

## 2015-09-06 DIAGNOSIS — Z3492 Encounter for supervision of normal pregnancy, unspecified, second trimester: Secondary | ICD-10-CM

## 2015-09-06 DIAGNOSIS — R109 Unspecified abdominal pain: Secondary | ICD-10-CM

## 2015-09-06 DIAGNOSIS — O9989 Other specified diseases and conditions complicating pregnancy, childbirth and the puerperium: Secondary | ICD-10-CM

## 2015-09-06 LAB — POCT URINALYSIS DIPSTICK
Bilirubin, UA: NEGATIVE
Glucose, UA: NEGATIVE
Ketones, UA: NEGATIVE
Nitrite, UA: NEGATIVE
PH UA: 6.5
PROTEIN UA: NEGATIVE
SPEC GRAV UA: 1.025
UROBILINOGEN UA: NEGATIVE
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
## 2015-09-08 DIAGNOSIS — F410 Panic disorder [episodic paroxysmal anxiety] without agoraphobia: Secondary | ICD-10-CM | POA: Insufficient documentation

## 2015-09-08 DIAGNOSIS — F430 Acute stress reaction: Secondary | ICD-10-CM

## 2015-09-08 DIAGNOSIS — F419 Anxiety disorder, unspecified: Secondary | ICD-10-CM | POA: Insufficient documentation

## 2015-09-21 ENCOUNTER — Ambulatory Visit (INDEPENDENT_AMBULATORY_CARE_PROVIDER_SITE_OTHER): Payer: Medicaid Other

## 2015-09-21 DIAGNOSIS — Z3482 Encounter for supervision of other normal pregnancy, second trimester: Secondary | ICD-10-CM | POA: Diagnosis not present

## 2015-09-21 DIAGNOSIS — Z3492 Encounter for supervision of normal pregnancy, unspecified, second trimester: Secondary | ICD-10-CM

## 2015-10-01 ENCOUNTER — Other Ambulatory Visit: Payer: Self-pay | Admitting: Obstetrics and Gynecology

## 2015-10-01 DIAGNOSIS — Z0489 Encounter for examination and observation for other specified reasons: Secondary | ICD-10-CM

## 2015-10-01 DIAGNOSIS — IMO0002 Reserved for concepts with insufficient information to code with codable children: Secondary | ICD-10-CM

## 2015-10-02 ENCOUNTER — Telehealth: Payer: Self-pay | Admitting: Obstetrics and Gynecology

## 2015-10-02 NOTE — Telephone Encounter (Signed)
This pt needs to be seen, she was recently treated for yeast. She may also have a UTI. She can see Defran or call back and take a same day slot on Dr.Cherry's schedule. Please call and inform her, thanks

## 2015-10-02 NOTE — Telephone Encounter (Signed)
PT FEELS LIKE SHE HAS A YEAST INF, ITCHY, YESTERDAY A PINKISH DISCHARGE, STINGS WHEN SHE PEES. PLEASE CALL PT// PT IS USING VAGISIL AND ITS NOT WORKING

## 2015-10-03 ENCOUNTER — Ambulatory Visit (INDEPENDENT_AMBULATORY_CARE_PROVIDER_SITE_OTHER): Payer: Medicaid Other

## 2015-10-03 DIAGNOSIS — Z0489 Encounter for examination and observation for other specified reasons: Secondary | ICD-10-CM

## 2015-10-03 DIAGNOSIS — Z36 Encounter for antenatal screening of mother: Secondary | ICD-10-CM | POA: Diagnosis not present

## 2015-10-03 DIAGNOSIS — IMO0002 Reserved for concepts with insufficient information to code with codable children: Secondary | ICD-10-CM

## 2015-10-04 ENCOUNTER — Ambulatory Visit (INDEPENDENT_AMBULATORY_CARE_PROVIDER_SITE_OTHER): Payer: Medicaid Other | Admitting: Obstetrics and Gynecology

## 2015-10-04 ENCOUNTER — Encounter: Payer: Self-pay | Admitting: Obstetrics and Gynecology

## 2015-10-04 VITALS — BP 99/62 | HR 87 | Wt 137.4 lb

## 2015-10-04 DIAGNOSIS — Z3492 Encounter for supervision of normal pregnancy, unspecified, second trimester: Secondary | ICD-10-CM | POA: Diagnosis not present

## 2015-10-04 DIAGNOSIS — N898 Other specified noninflammatory disorders of vagina: Secondary | ICD-10-CM

## 2015-10-04 LAB — POCT URINALYSIS DIPSTICK
GLUCOSE UA: NEGATIVE
KETONES UA: NEGATIVE
Nitrite, UA: NEGATIVE
Protein, UA: NEGATIVE
SPEC GRAV UA: 1.01
UROBILINOGEN UA: 0.2
pH, UA: 7.5

## 2015-10-04 MED ORDER — TERCONAZOLE 0.8 % VA CREA: 1.0000 | TOPICAL_CREAM | Freq: Every day | VAGINAL | Status: DC

## 2015-10-04 NOTE — Progress Notes (Signed)
Rob- pt has recurring yeast infections pt took fluconazole that was prescribed 08/29/2015 but is now having sysmptoms again. Physical exam: Speculum exam-moderately thick white discharge, non-malodorous; wet prep-normal saline leukorrhea present, KOH-negative. Plan: Terazol 3 cream; nu swab sent.

## 2015-10-05 ENCOUNTER — Other Ambulatory Visit: Payer: Medicaid Other

## 2015-10-05 NOTE — Patient Instructions (Signed)
1. Terazol 3 cream intravaginal 3 days 2. Aptima nu swab sent; patient will be notified of results

## 2015-10-07 LAB — NUSWAB BV AND CANDIDA, NAA
Atopobium vaginae: HIGH Score — AB
Candida albicans, NAA: POSITIVE — AB
Candida glabrata, NAA: NEGATIVE

## 2015-10-16 ENCOUNTER — Ambulatory Visit (INDEPENDENT_AMBULATORY_CARE_PROVIDER_SITE_OTHER): Payer: Medicaid Other | Admitting: Obstetrics and Gynecology

## 2015-10-16 VITALS — BP 95/58 | HR 97 | Wt 140.1 lb

## 2015-10-16 DIAGNOSIS — Z3492 Encounter for supervision of normal pregnancy, unspecified, second trimester: Secondary | ICD-10-CM

## 2015-10-16 DIAGNOSIS — B309 Viral conjunctivitis, unspecified: Secondary | ICD-10-CM

## 2015-10-16 DIAGNOSIS — F419 Anxiety disorder, unspecified: Secondary | ICD-10-CM

## 2015-10-16 DIAGNOSIS — Z3482 Encounter for supervision of other normal pregnancy, second trimester: Secondary | ICD-10-CM

## 2015-10-16 LAB — POCT URINALYSIS DIPSTICK
BILIRUBIN UA: NEGATIVE
Blood, UA: NEGATIVE
Glucose, UA: NEGATIVE
Ketones, UA: NEGATIVE
NITRITE UA: NEGATIVE
PH UA: 6.5
PROTEIN UA: NEGATIVE
Spec Grav, UA: 1.02
Urobilinogen, UA: NEGATIVE

## 2015-10-16 NOTE — Patient Instructions (Signed)
Viral Conjunctivitis Viral conjunctivitis is an inflammation of the clear membrane that covers the white part of your eye and the inner surface of your eyelid (conjunctiva). The inflammation is caused by a viral infection. The blood vessels in the conjunctiva become inflamed, causing the eye to become red or pink, and often itchy. Viral conjunctivitis can easily be passed from one person to another (contagious). CAUSES  Viral conjunctivitis is caused by a virus. A virus is a type of contagious germ. It can be spread by touching objects that have been contaminated with the virus, such as doorknobs or towels.  SYMPTOMS  Symptoms of viral conjunctivitis may include:   Eye redness.  Tearing or watery eyes.  Itchy eyes.  Burning feeling in the eyes.  Clear drainage from the eye.  Swollen eyelids.  A gritty feeling in the eye.  Light sensitivity. DIAGNOSIS  Viral conjunctivitis may be diagnosed with a medical history and physical exam. If you have discharge from your eye, the discharge may be tested to rule out other causes of conjunctivitis.  TREATMENT  Viral conjunctivitis does not respond to medicines that kill bacteria (antibiotics). Treatment for viral conjunctivitis is directed at stopping a bacterial infection from developing in addition to the viral infection. Treatment also aims to relieve your symptoms, such as itching. This may be done with antihistamine drops or other eye medicines. HOME CARE INSTRUCTIONS  Take medicines only as directed by your health care provider.  Avoid touching or rubbing your eyes.  Apply a warm, clean washcloth to your eye for 10-20 minutes, 3-4 times per day.  If you wear contact lenses, do not wear them until the inflammation is gone and your health care provider says it is safe to wear them again. Ask your health care provider how to sterilize or replace your contact lenses before using them again. Wear glasses until you can resume wearing  contacts.  Avoid wearing eye makeup until the inflammation is gone. Throw away any old eye cosmetics that may be contaminated.  Change or wash your pillowcase every day.  Do not share towels or washcloths. This may spread the infection.  Wash your hands often with soap and water. Use paper towels to dry your hands.  Gently wipe away any drainage from your eye with a warm, wet washcloth or a cotton ball.  Be very careful to avoid touching the edge of the eyelid with the eye drop bottle or ointment tube when applying medicines to the affected eye. This will stop you from spreading the infection to the other eye or to other people. SEEK MEDICAL CARE IF:   Your symptoms do not improve with treatment.  You have increased pain.  Your vision becomes blurry.  You have a fever.  You have facial pain, redness, or swelling.  You have new symptoms.  Your symptoms get worse.   This information is not intended to replace advice given to you by your health care provider. Make sure you discuss any questions you have with your health care provider.   Document Released: 05/31/2002 Document Revised: 09/01/2005 Document Reviewed: 12/20/2013 Elsevier Interactive Patient Education 2016 Elsevier Inc.  

## 2015-10-16 NOTE — Progress Notes (Signed)
ROB: Patient ROB: Patient doing ok.  Notes anxiety symptoms have improved with therapy.  Does not that grandfather passed last week.  Is coping fairly well.  Also c/o right eye discharge and debris.  Likely viral conjunctivitis. Discussed conservative measures (hand hygiene, wet compresses to the eye).  Advised if symptoms worsen or last beyond 5-7 days, or progresses to bilateral eyes, to contact MD for prescription eye drops. S/p f/u anatomy scan with normal anatomy.  RTC in 4 weeks. For 28 week labs at that time.

## 2015-10-23 ENCOUNTER — Encounter: Payer: Medicaid Other | Admitting: Obstetrics and Gynecology

## 2015-10-31 ENCOUNTER — Telehealth: Payer: Self-pay | Admitting: Obstetrics and Gynecology

## 2015-10-31 NOTE — Telephone Encounter (Signed)
Please have this pt come in for an appt as this is a reoccurring issue for her. Thanks

## 2015-10-31 NOTE — Telephone Encounter (Signed)
PT IS COMING IN ON 8/15 TO SEE DR DE FOR THIS PROBLEM

## 2015-10-31 NOTE — Telephone Encounter (Signed)
PT CALLED AND SHE IS [redacted] WEEKS PREGNANT AND SHE IS HAVING YEAST INFECTION SYMPTOMS AGAIN AND WANTED TO KNOW IF SHE NEEDS TO COME IN OR IF SOMETHING COULD BE PERSCRIBED

## 2015-11-06 ENCOUNTER — Encounter: Payer: Self-pay | Admitting: Obstetrics and Gynecology

## 2015-11-06 ENCOUNTER — Ambulatory Visit (INDEPENDENT_AMBULATORY_CARE_PROVIDER_SITE_OTHER): Payer: Medicaid Other | Admitting: Obstetrics and Gynecology

## 2015-11-06 VITALS — BP 114/70 | HR 102 | Wt 144.6 lb

## 2015-11-06 DIAGNOSIS — B3731 Acute candidiasis of vulva and vagina: Secondary | ICD-10-CM

## 2015-11-06 DIAGNOSIS — Z3492 Encounter for supervision of normal pregnancy, unspecified, second trimester: Secondary | ICD-10-CM

## 2015-11-06 DIAGNOSIS — B373 Candidiasis of vulva and vagina: Secondary | ICD-10-CM

## 2015-11-06 LAB — POCT URINALYSIS DIPSTICK
BILIRUBIN UA: NEGATIVE
Blood, UA: NEGATIVE
Glucose, UA: NEGATIVE
KETONES UA: NEGATIVE
Nitrite, UA: NEGATIVE
PH UA: 6
Protein, UA: NEGATIVE
Spec Grav, UA: 1.015
Urobilinogen, UA: 0.2

## 2015-11-06 MED ORDER — FLUCONAZOLE 150 MG PO TABS: 150.0000 mg | ORAL_TABLET | Freq: Once | ORAL | 1 refills | Status: AC

## 2015-11-06 NOTE — Patient Instructions (Signed)
1. Take Diflucan 150 mg orally. If symptoms persist take a second dose 3 days later. 2. Keep regular OB appointment as scheduled

## 2015-11-06 NOTE — Progress Notes (Signed)
Nursing intake: Recurrent y/i. 3 over the last 2 months. Treated with diflucan and terazol. Sx last week. None today.   Chief complaint: 1. Vulvitis symptoms  Minimal symptoms today. Vaginal discharge and vulvar itching more significant last week. No recent antibiotic use.  Past medical history, past surgical history, problem list, medications, and allergies are reviewed  OBJECTIVE: BP 114/70   Pulse (!) 102   Wt 144 lb 9.6 oz (65.6 kg)   LMP 04/24/2015 (Exact Date)   BMI 24.82 kg/m  Pleasant gravid female in no acute distress Abdomen: Fundal height-26 cm; fetal heart rate 145 Pelvic: External genitalia-normal BUS-normal Vagina-moderate amount of mucus and white secretions in vaginal vault Cervix-no lesions; long, soft, closed  Procedure: Wet prep Normal saline-moderate white blood cells; no Trichomonas; no clue cells KOH-budding yeast  ASSESSMENT: 1. 26.5 week intrauterine pregnancy 2. Monilia vulvovaginitis  PLAN: 1. Diflucan 150 mg by mouth times one dose; may repeat dose in 3 days if symptoms persist 2. Keep regular OB appointment as scheduled.  A total of 15 minutes were spent face-to-face with the patient during this encounter and over half of that time dealt with counseling and coordination of care.  Brayton Mars, MD  Note: This dictation was prepared with Dragon dictation along with smaller phrase technology. Any transcriptional errors that result from this process are unintentional.

## 2015-11-13 ENCOUNTER — Other Ambulatory Visit: Payer: Self-pay | Admitting: Obstetrics and Gynecology

## 2015-11-13 ENCOUNTER — Ambulatory Visit (INDEPENDENT_AMBULATORY_CARE_PROVIDER_SITE_OTHER): Payer: Medicaid Other | Admitting: Obstetrics and Gynecology

## 2015-11-13 ENCOUNTER — Other Ambulatory Visit: Payer: Medicaid Other

## 2015-11-13 VITALS — BP 111/74 | HR 90 | Wt 148.5 lb

## 2015-11-13 DIAGNOSIS — Z23 Encounter for immunization: Secondary | ICD-10-CM

## 2015-11-13 DIAGNOSIS — Z3482 Encounter for supervision of other normal pregnancy, second trimester: Secondary | ICD-10-CM | POA: Diagnosis not present

## 2015-11-13 DIAGNOSIS — Z3492 Encounter for supervision of normal pregnancy, unspecified, second trimester: Secondary | ICD-10-CM

## 2015-11-13 DIAGNOSIS — Z131 Encounter for screening for diabetes mellitus: Secondary | ICD-10-CM

## 2015-11-13 LAB — POCT URINALYSIS DIPSTICK
BILIRUBIN UA: NEGATIVE
GLUCOSE UA: NEGATIVE
Ketones, UA: NEGATIVE
LEUKOCYTES UA: NEGATIVE
NITRITE UA: NEGATIVE
Protein, UA: NEGATIVE
Spec Grav, UA: 1.01
UROBILINOGEN UA: NEGATIVE
pH, UA: 7.5

## 2015-11-13 MED ORDER — TETANUS-DIPHTH-ACELL PERTUSSIS 5-2.5-18.5 LF-MCG/0.5 IM SUSP
0.5000 mL | Freq: Once | INTRAMUSCULAR | Status: AC
  Administered 2015-11-13: 0.5 mL via INTRAMUSCULAR
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
## 2015-11-14 LAB — GLUCOSE, 1 HOUR GESTATIONAL: Gestational Diabetes Screen: 68 mg/dL (ref 65–139)

## 2015-11-14 LAB — HEMATOCRIT: Hematocrit: 30.1 % — ABNORMAL LOW (ref 34.0–46.6)

## 2015-11-14 LAB — HEMOGLOBIN: Hemoglobin: 10.1 g/dL — ABNORMAL LOW (ref 11.1–15.9)

## 2015-11-29 ENCOUNTER — Ambulatory Visit (INDEPENDENT_AMBULATORY_CARE_PROVIDER_SITE_OTHER): Payer: Medicaid Other | Admitting: Obstetrics and Gynecology

## 2015-11-29 VITALS — BP 100/63 | HR 98 | Wt 149.4 lb

## 2015-11-29 DIAGNOSIS — Z3493 Encounter for supervision of normal pregnancy, unspecified, third trimester: Secondary | ICD-10-CM

## 2015-11-29 DIAGNOSIS — K831 Obstruction of bile duct: Secondary | ICD-10-CM

## 2015-11-29 DIAGNOSIS — O26619 Liver and biliary tract disorders in pregnancy, unspecified trimester: Secondary | ICD-10-CM

## 2015-11-29 DIAGNOSIS — L299 Pruritus, unspecified: Secondary | ICD-10-CM

## 2015-11-29 LAB — POCT URINALYSIS DIPSTICK
Bilirubin, UA: NEGATIVE
Glucose, UA: NEGATIVE
Ketones, UA: NEGATIVE
LEUKOCYTES UA: NEGATIVE
NITRITE UA: NEGATIVE
PH UA: 6.5
PROTEIN UA: NEGATIVE
Spec Grav, UA: 1.015
UROBILINOGEN UA: NEGATIVE

## 2015-11-29 NOTE — Progress Notes (Signed)
ROB: Notes itching has started on abdomen and back of neck and head, mild.  Also with occasional painful [address].  Is noting more frequent nosebleeds (discussed management measures). Scalp examined, no evidence of lice, advised on moisturizing conditioner, anti-itch formula. Will order labs to r/o cholestasis of pregnancy as patient with history in prior pregnancy which required IOL at 37 weeks.  Advised on Benadryl prn itching for now.

## 2015-11-30 LAB — HEPATIC FUNCTION PANEL
ALBUMIN: 3.6 g/dL (ref 3.5–5.5)
ALK PHOS: 137 IU/L — AB (ref 39–117)
ALT: 15 IU/L (ref 0–32)
AST: 19 IU/L (ref 0–40)
BILIRUBIN, DIRECT: 0.05 mg/dL (ref 0.00–0.40)
Bilirubin Total: <0.2 mg/dL (ref 0.0–1.2)
TOTAL PROTEIN: 6.6 g/dL (ref 6.0–8.5)

## 2015-11-30 LAB — BILE ACIDS, TOTAL: BILE ACIDS TOTAL: 11 umol/L (ref 4.7–24.5)

## 2015-12-13 ENCOUNTER — Observation Stay: Payer: Medicaid Other

## 2015-12-13 ENCOUNTER — Observation Stay
Admission: EM | Admit: 2015-12-13 | Discharge: 2015-12-13 | Disposition: A | Discharge status: Home / Self Care | Payer: Medicaid Other | Attending: Obstetrics and Gynecology | Admitting: Obstetrics and Gynecology

## 2015-12-13 ENCOUNTER — Ambulatory Visit (INDEPENDENT_AMBULATORY_CARE_PROVIDER_SITE_OTHER): Payer: Medicaid Other | Admitting: Obstetrics and Gynecology

## 2015-12-13 VITALS — BP 118/65 | HR 81 | Wt 153.1 lb

## 2015-12-13 DIAGNOSIS — Z3A32 32 weeks gestation of pregnancy: Secondary | ICD-10-CM | POA: Diagnosis not present

## 2015-12-13 DIAGNOSIS — Z87442 Personal history of urinary calculi: Secondary | ICD-10-CM

## 2015-12-13 DIAGNOSIS — N1330 Unspecified hydronephrosis: Secondary | ICD-10-CM | POA: Insufficient documentation

## 2015-12-13 DIAGNOSIS — R109 Unspecified abdominal pain: Secondary | ICD-10-CM | POA: Diagnosis not present

## 2015-12-13 DIAGNOSIS — R1032 Left lower quadrant pain: Secondary | ICD-10-CM

## 2015-12-13 DIAGNOSIS — O26899 Other specified pregnancy related conditions, unspecified trimester: Secondary | ICD-10-CM

## 2015-12-13 DIAGNOSIS — Z8759 Personal history of other complications of pregnancy, childbirth and the puerperium: Secondary | ICD-10-CM

## 2015-12-13 DIAGNOSIS — O26893 Other specified pregnancy related conditions, third trimester: Secondary | ICD-10-CM | POA: Diagnosis present

## 2015-12-13 DIAGNOSIS — O9989 Other specified diseases and conditions complicating pregnancy, childbirth and the puerperium: Principal | ICD-10-CM | POA: Insufficient documentation

## 2015-12-13 DIAGNOSIS — Z3493 Encounter for supervision of normal pregnancy, unspecified, third trimester: Secondary | ICD-10-CM

## 2015-12-13 LAB — COMPREHENSIVE METABOLIC PANEL
ALBUMIN: 2.9 g/dL — AB (ref 3.5–5.0)
ALK PHOS: 149 U/L — AB (ref 38–126)
ALT: 24 U/L (ref 14–54)
ANION GAP: 8 (ref 5–15)
AST: 33 U/L (ref 15–41)
BILIRUBIN TOTAL: 0.3 mg/dL (ref 0.3–1.2)
BUN: 8 mg/dL (ref 6–20)
CALCIUM: 8.7 mg/dL — AB (ref 8.9–10.3)
CO2: 21 mmol/L — AB (ref 22–32)
Chloride: 107 mmol/L (ref 101–111)
Creatinine, Ser: 0.53 mg/dL (ref 0.44–1.00)
GFR calc non Af Amer: >60 mL/min (ref >60)
GLUCOSE: 117 mg/dL — AB (ref 65–99)
POTASSIUM: 3.5 mmol/L (ref 3.5–5.1)
SODIUM: 136 mmol/L (ref 135–145)
TOTAL PROTEIN: 6.9 g/dL (ref 6.5–8.1)

## 2015-12-13 LAB — URINALYSIS COMPLETE WITH MICROSCOPIC (ARMC ONLY)
Bilirubin Urine: NEGATIVE
GLUCOSE, UA: NEGATIVE mg/dL
KETONES UR: NEGATIVE mg/dL
NITRITE: NEGATIVE
Protein, ur: NEGATIVE mg/dL
SPECIFIC GRAVITY, URINE: 1.015 (ref 1.005–1.030)
pH: 6 (ref 5.0–8.0)

## 2015-12-13 LAB — CBC
HCT: 30.8 % — ABNORMAL LOW (ref 35.0–47.0)
Hemoglobin: 10.9 g/dL — ABNORMAL LOW (ref 12.0–16.0)
MCH: 32.4 pg (ref 26.0–34.0)
MCHC: 35.5 g/dL (ref 32.0–36.0)
MCV: 91.4 fL (ref 80.0–100.0)
Platelets: 228 10*3/uL (ref 150–440)
RBC: 3.37 MIL/uL — ABNORMAL LOW (ref 3.80–5.20)
RDW: 12.4 % (ref 11.5–14.5)
WBC: 9.8 10*3/uL (ref 3.6–11.0)

## 2015-12-13 LAB — LIPASE, BLOOD: Lipase: 19 U/L (ref 11–51)

## 2015-12-13 MED ORDER — OXYCODONE-ACETAMINOPHEN 5-325 MG PO TABS
1.0000 | ORAL_TABLET | Freq: Four times a day (QID) | ORAL | Status: DC | PRN
Start: 2015-12-13 — End: 2015-12-14

## 2015-12-13 NOTE — Discharge Instructions (Signed)

## 2015-12-13 NOTE — Progress Notes (Signed)
Unable to void

## 2015-12-13 NOTE — Final Progress Note (Signed)
L&D OB Triage Note  Yvonne Morrison is a 20 y.o. G55P0101 female at [redacted]w[redacted]d, EDD Estimated Date of Delivery: 02/07/16 who was sent from clinic for further evaluation of acute onset of LLQ pain and also flank pain. Denied nausea/vomiting, dysuria or hematuria. Patient with a h/o renal stones. She was evaluated by the nurses with findings significant for LLQ pain. Vital signs stable. An NST was performed and has been reviewed by MD. She was treated with PO Percocet.   NST INTERPRETATION: Indications: patient reassurance and rule out uterine contractions  Mode: External Baseline Rate (A): 130 bpm Variability: Moderate Accelerations: 15 x 15 Decelerations: None     Contraction Frequency (min):  (none)  Impression: reactive   Labs:  Results for orders placed or performed during the hospital encounter of 12/13/15  CBC  Result Value Ref Range   WBC 9.8 3.6 - 11.0 K/uL   RBC 3.37 (L) 3.80 - 5.20 MIL/uL   Hemoglobin 10.9 (L) 12.0 - 16.0 g/dL   HCT 30.8 (L) 35.0 - 47.0 %   MCV 91.4 80.0 - 100.0 fL   MCH 32.4 26.0 - 34.0 pg   MCHC 35.5 32.0 - 36.0 g/dL   RDW 12.4 11.5 - 14.5 %   Platelets 228 150 - 440 K/uL  Comprehensive metabolic panel  Result Value Ref Range   Sodium 136 135 - 145 mmol/L   Potassium 3.5 3.5 - 5.1 mmol/L   Chloride 107 101 - 111 mmol/L   CO2 21 (L) 22 - 32 mmol/L   Glucose, Bld 117 (H) 65 - 99 mg/dL   BUN 8 6 - 20 mg/dL   Creatinine, Ser 0.53 0.44 - 1.00 mg/dL   Calcium 8.7 (L) 8.9 - 10.3 mg/dL   Total Protein 6.9 6.5 - 8.1 g/dL   Albumin 2.9 (L) 3.5 - 5.0 g/dL   AST 33 15 - 41 U/L   ALT 24 14 - 54 U/L   Alkaline Phosphatase 149 (H) 38 - 126 U/L   Total Bilirubin 0.3 0.3 - 1.2 mg/dL   GFR calc non Af Amer >60 >60 mL/min   GFR calc Af Amer >60 >60 mL/min   Anion gap 8 5 - 15  Lipase, blood  Result Value Ref Range   Lipase 19 11 - 51 U/L     Imaging (Renal Ultrasound, 12/13/2015):  EXAM: RENAL / URINARY TRACT ULTRASOUND COMPLETE  COMPARISON:   None.  FINDINGS: Right Kidney:  Length: 10.7 cm. Echogenicity within normal limits. No mass identified. Mild pelvicaliectasis is seen.  Left Kidney:  Length: 11.1 cm. Echogenicity within normal limits. No mass identified. Moderate pelvicaliectasis is seen.  Bladder:  Appears normal for degree of bladder distention. Gravid uterus noted, with mass effect on the bladder.  IMPRESSION: Bilateral renal pelvicaliectasis, left side greater than right.  Plan: NST performed was reviewed and was found to be reactive. She was discharged home.  Continue routine prenatal care. Follow up with OB/GYN as previously scheduled. Follow up sooner if pain does not improve or worsens.     [name], MD [facility]

## 2015-12-16 NOTE — Progress Notes (Signed)
ROB: Patient complains of moderate to severe LLQ pain that began approximately 2 hrs ago.  Denies constipation, nausea/vomiting. Has h/o kidney stones.  Exam reveals mild right sided tenderness, moderate left sided tenderness, and left flank pain.  Will send to L&D for further evaluation.  Patient unable to void during visit. Will assess on L&D. RTC in 2 weeks.

## 2015-12-26 ENCOUNTER — Ambulatory Visit (INDEPENDENT_AMBULATORY_CARE_PROVIDER_SITE_OTHER): Payer: Medicaid Other | Admitting: Obstetrics and Gynecology

## 2015-12-26 ENCOUNTER — Encounter: Payer: Self-pay | Admitting: Obstetrics and Gynecology

## 2015-12-26 VITALS — BP 111/70 | HR 105 | Wt 153.4 lb

## 2015-12-26 DIAGNOSIS — O26619 Liver and biliary tract disorders in pregnancy, unspecified trimester: Secondary | ICD-10-CM

## 2015-12-26 DIAGNOSIS — Z3483 Encounter for supervision of other normal pregnancy, third trimester: Secondary | ICD-10-CM

## 2015-12-26 DIAGNOSIS — Z369 Encounter for antenatal screening, unspecified: Secondary | ICD-10-CM

## 2015-12-26 DIAGNOSIS — L299 Pruritus, unspecified: Secondary | ICD-10-CM

## 2015-12-26 DIAGNOSIS — Z1389 Encounter for screening for other disorder: Secondary | ICD-10-CM

## 2015-12-26 DIAGNOSIS — K831 Obstruction of bile duct: Secondary | ICD-10-CM

## 2015-12-26 LAB — POCT URINALYSIS DIPSTICK
BILIRUBIN UA: NEGATIVE
Blood, UA: NEGATIVE
GLUCOSE UA: NEGATIVE
Ketones, UA: NEGATIVE
NITRITE UA: NEGATIVE
Protein, UA: NEGATIVE
Spec Grav, UA: 1.01
Urobilinogen, UA: NEGATIVE
pH, UA: 7

## 2015-12-26 MED ORDER — HYDROXYZINE HCL 25 MG PO TABS: 25.0000 mg | ORAL_TABLET | Freq: Four times a day (QID) | ORAL | 2 refills | Status: DC | PRN

## 2015-12-26 NOTE — Progress Notes (Signed)
ROB: Patient complains of increased itching of head, palms, and abdomen.  Patient denies rash.  Will recheck labs to assess for cholestasis of pregnancy (as pt with prior history). Will also assess AFI/growth. Prescribed Atarax for itching.

## 2015-12-27 ENCOUNTER — Encounter: Payer: Medicaid Other | Admitting: Obstetrics and Gynecology

## 2015-12-27 LAB — SPECIMEN STATUS

## 2015-12-28 ENCOUNTER — Telehealth: Payer: Self-pay | Admitting: Obstetrics and Gynecology

## 2015-12-28 NOTE — Telephone Encounter (Signed)
Please advise 

## 2015-12-28 NOTE — Telephone Encounter (Signed)
Dr Marcelline Mates told her lab results would be back in a day and that she would give her a call. Its been 2 days.

## 2015-12-29 LAB — HEPATIC FUNCTION PANEL
ALT: 40 IU/L — AB (ref 0–32)
AST: 30 IU/L (ref 0–40)
Albumin: 3.4 g/dL — ABNORMAL LOW (ref 3.5–5.5)
Alkaline Phosphatase: 201 IU/L — ABNORMAL HIGH (ref 39–117)
BILIRUBIN TOTAL: 0.3 mg/dL (ref 0.0–1.2)
Bilirubin, Direct: 0.13 mg/dL (ref 0.00–0.40)
TOTAL PROTEIN: 6.4 g/dL (ref 6.0–8.5)

## 2015-12-29 LAB — VARICELLA ZOSTER ANTIBODY, IGG: Varicella zoster IgG: 1775 index (ref >165)

## 2015-12-29 LAB — BILE ACIDS, TOTAL: BILE ACIDS TOTAL: 17.2 umol/L (ref 4.7–24.5)

## 2015-12-29 LAB — RUBELLA SCREEN: RUBELLA: 14.1 {index} (ref >0.99)

## 2015-12-30 ENCOUNTER — Encounter: Payer: Self-pay | Admitting: Obstetrics and Gynecology

## 2015-12-30 NOTE — Telephone Encounter (Signed)
One of the labs took slightly longer to return than expected, so unfortunately I was not able to inform her of her labs, and then was out of the office on Friday.  I have since reviewed her labs and sent her a Mychart message.

## 2016-01-01 ENCOUNTER — Telehealth: Payer: Self-pay | Admitting: Obstetrics and Gynecology

## 2016-01-01 DIAGNOSIS — O26613 Liver and biliary tract disorders in pregnancy, third trimester: Principal | ICD-10-CM

## 2016-01-01 DIAGNOSIS — K831 Obstruction of bile duct: Secondary | ICD-10-CM

## 2016-01-01 NOTE — Telephone Encounter (Signed)
PT CALLED AND HER BLOOD WORK WAS NORMAL FOR THE COLICSTITIS AND SHE STATING THAT HER SYMPTOMS ARE GETTING MORE INTENSE AND WANTED TO KNOW IF SHE CAN COME IN THIS WEEK FOR THE BLOOD WORK INSTEAD OF NEXT WEEK, PT WOULD LIKE A CALL BACK ON IF SHE CAN.

## 2016-01-01 NOTE — Telephone Encounter (Signed)
Called pt she states that sx of cholestasis are getting worse despite use of medication. Advised pt to increase atarax to 50mg  every 6 hours, also take benadryl. To come tomorrow for labs.

## 2016-01-02 ENCOUNTER — Other Ambulatory Visit: Payer: Medicaid Other

## 2016-01-02 ENCOUNTER — Ambulatory Visit (INDEPENDENT_AMBULATORY_CARE_PROVIDER_SITE_OTHER): Payer: Medicaid Other | Admitting: Obstetrics and Gynecology

## 2016-01-02 DIAGNOSIS — K831 Obstruction of bile duct: Secondary | ICD-10-CM

## 2016-01-02 DIAGNOSIS — L299 Pruritus, unspecified: Secondary | ICD-10-CM

## 2016-01-02 DIAGNOSIS — F419 Anxiety disorder, unspecified: Secondary | ICD-10-CM

## 2016-01-02 DIAGNOSIS — O26613 Liver and biliary tract disorders in pregnancy, third trimester: Secondary | ICD-10-CM

## 2016-01-04 LAB — BILE ACIDS, TOTAL: Bile Acids Total: 46.2 umol/L — ABNORMAL HIGH (ref 4.7–24.5)

## 2016-01-04 LAB — HEPATIC FUNCTION PANEL
ALK PHOS: 230 IU/L — AB (ref 39–117)
ALT: 82 IU/L — ABNORMAL HIGH (ref 0–32)
AST: 50 IU/L — ABNORMAL HIGH (ref 0–40)
Albumin: 3.6 g/dL (ref 3.5–5.5)
Bilirubin Total: 0.3 mg/dL (ref 0.0–1.2)
Bilirubin, Direct: 0.11 mg/dL (ref 0.00–0.40)
TOTAL PROTEIN: 6.8 g/dL (ref 6.0–8.5)

## 2016-01-04 NOTE — Progress Notes (Signed)
ROB Consult: Patient initially presented for annual labs only visit, however desired to discuss results of previous lab results and person. Patient notes that she is very anxious and concerned regarding the potential for developing cholestasis of pregnancy as she had in her prior pregnancy. Notes that when she reviewed her labs on my chart that several values were elevated despite being told that her labs were okay. Discussed each individual lab value with the patient, discussing that reference values may defer based on a pregnant and nonpregnant patient. Given parameters of what would be considered abnormal and pregnancy. Patient notes understanding. Will notify patient of new lab results once they return.  Patient to continue to use hydroxyzine and Benadryl as needed for itching, hydroxyzine was recently increased to 50 mg yesterday.  A total of 15 minutes were spent face-to-face with the patient during this encounter and over half of that time dealt with counseling and coordination of care.   [name], MD [facility] 01/04/2016 12:37 PM

## 2016-01-05 ENCOUNTER — Other Ambulatory Visit: Payer: Self-pay | Admitting: Obstetrics and Gynecology

## 2016-01-05 MED ORDER — URSODIOL 300 MG PO CAPS: 300.0000 mg | ORAL_CAPSULE | Freq: Three times a day (TID) | ORAL | 1 refills | Status: DC

## 2016-01-07 ENCOUNTER — Telehealth: Payer: Self-pay | Admitting: Obstetrics and Gynecology

## 2016-01-07 ENCOUNTER — Other Ambulatory Visit: Payer: Self-pay | Admitting: Obstetrics and Gynecology

## 2016-01-07 DIAGNOSIS — K831 Obstruction of bile duct: Secondary | ICD-10-CM

## 2016-01-07 DIAGNOSIS — O26613 Liver and biliary tract disorders in pregnancy, third trimester: Principal | ICD-10-CM

## 2016-01-07 NOTE — Telephone Encounter (Signed)
PT CALLED AND SHE GOT HER BLOOD WORK BACK ON Saturday AND SHE WAS TOLD THAT HSE NEEDED TO COME IN THIS WEEK AND HAVE AN [place], SHE ALREADY HAS ONE SET UP FOR Thursday, BUT SHE WASN'T SURE IF DR CHERRY WANTED HER TO COME IN EARLIER FOR ONE FOR IF THE ONE FOR Thursday WILL BE OK.

## 2016-01-07 NOTE — Telephone Encounter (Signed)
PLEASE DISREGARD MESSAGE, DR CHERRY SENT ME A STAFF MESSAGE WITH WHAT SHE WANTS PT TO DO.

## 2016-01-08 ENCOUNTER — Other Ambulatory Visit: Payer: Medicaid Other

## 2016-01-08 ENCOUNTER — Other Ambulatory Visit: Payer: Self-pay | Admitting: Obstetrics and Gynecology

## 2016-01-08 ENCOUNTER — Ambulatory Visit (INDEPENDENT_AMBULATORY_CARE_PROVIDER_SITE_OTHER): Payer: Medicaid Other | Admitting: Obstetrics and Gynecology

## 2016-01-08 ENCOUNTER — Ambulatory Visit (INDEPENDENT_AMBULATORY_CARE_PROVIDER_SITE_OTHER): Payer: Medicaid Other

## 2016-01-08 VITALS — BP 104/67 | HR 91 | Wt 156.0 lb

## 2016-01-08 DIAGNOSIS — O26613 Liver and biliary tract disorders in pregnancy, third trimester: Secondary | ICD-10-CM

## 2016-01-08 DIAGNOSIS — Z3685 Encounter for antenatal screening for Streptococcus B: Secondary | ICD-10-CM

## 2016-01-08 DIAGNOSIS — Z23 Encounter for immunization: Secondary | ICD-10-CM | POA: Diagnosis not present

## 2016-01-08 DIAGNOSIS — O4100X0 Oligohydramnios, unspecified trimester, not applicable or unspecified: Secondary | ICD-10-CM | POA: Insufficient documentation

## 2016-01-08 DIAGNOSIS — K831 Obstruction of bile duct: Secondary | ICD-10-CM

## 2016-01-08 DIAGNOSIS — Z3A36 36 weeks gestation of pregnancy: Secondary | ICD-10-CM | POA: Diagnosis not present

## 2016-01-08 DIAGNOSIS — Z113 Encounter for screening for infections with a predominantly sexual mode of transmission: Secondary | ICD-10-CM

## 2016-01-08 LAB — POCT URINALYSIS DIPSTICK
BILIRUBIN UA: NEGATIVE
Glucose, UA: NEGATIVE
KETONES UA: NEGATIVE
LEUKOCYTES UA: NEGATIVE
Nitrite, UA: NEGATIVE
PH UA: 6
Protein, UA: NEGATIVE
SPEC GRAV UA: 1.015
Urobilinogen, UA: NEGATIVE

## 2016-01-08 NOTE — Progress Notes (Signed)
ROB: Patient with newly diagnosed cholestasis of pregnancy by labs and symptoms, will plan for delivery at 36 weeks.  36 week labs done today.  For flu vaccine today. NST performed today was reviewed and was found to be reactive.  U/S for AFI check notes oligohydramnios with AFI at 5.6 cm, normal growth.  Fetal kick counts instructed.     NONSTRESS TEST INTERPRETATION  INDICATIONS: Cholestasis of pregnancy and Oligohydramnios  FHR baseline: 140 bpm RESULTS:Reactive COMMENTS: 2 contractions noted   PLAN: 1. Continue fetal kick counts twice a day. 2. Scheduled for IOL in 2 days.

## 2016-01-10 ENCOUNTER — Inpatient Hospital Stay
Admission: EM | Admit: 2016-01-10 | Discharge: 2016-01-12 | DRG: 775 | Disposition: A | Discharge status: Home / Self Care | Payer: Medicaid Other | Attending: Obstetrics and Gynecology | Admitting: Obstetrics and Gynecology

## 2016-01-10 ENCOUNTER — Encounter: Payer: Medicaid Other | Admitting: Obstetrics and Gynecology

## 2016-01-10 ENCOUNTER — Other Ambulatory Visit: Payer: Medicaid Other

## 2016-01-10 DIAGNOSIS — K831 Obstruction of bile duct: Secondary | ICD-10-CM | POA: Diagnosis present

## 2016-01-10 DIAGNOSIS — O26613 Liver and biliary tract disorders in pregnancy, third trimester: Secondary | ICD-10-CM

## 2016-01-10 DIAGNOSIS — Z3A36 36 weeks gestation of pregnancy: Secondary | ICD-10-CM | POA: Diagnosis not present

## 2016-01-10 DIAGNOSIS — O2662 Liver and biliary tract disorders in childbirth: Secondary | ICD-10-CM | POA: Diagnosis present

## 2016-01-10 DIAGNOSIS — O4103X0 Oligohydramnios, third trimester, not applicable or unspecified: Secondary | ICD-10-CM | POA: Diagnosis present

## 2016-01-10 DIAGNOSIS — Z3483 Encounter for supervision of other normal pregnancy, third trimester: Secondary | ICD-10-CM | POA: Diagnosis not present

## 2016-01-10 DIAGNOSIS — Z833 Family history of diabetes mellitus: Secondary | ICD-10-CM

## 2016-01-10 LAB — CBC
HCT: 33.5 % — ABNORMAL LOW (ref 35.0–47.0)
HEMOGLOBIN: 11.4 g/dL — AB (ref 12.0–16.0)
MCH: 31.2 pg (ref 26.0–34.0)
MCHC: 33.9 g/dL (ref 32.0–36.0)
MCV: 91.8 fL (ref 80.0–100.0)
Platelets: 238 10*3/uL (ref 150–440)
RBC: 3.65 MIL/uL — ABNORMAL LOW (ref 3.80–5.20)
RDW: 12.5 % (ref 11.5–14.5)
WBC: 12.1 10*3/uL — ABNORMAL HIGH (ref 3.6–11.0)

## 2016-01-10 LAB — TYPE AND SCREEN
ABO/RH(D): O POS
ANTIBODY SCREEN: NEGATIVE

## 2016-01-10 LAB — RAPID HIV SCREEN (HIV 1/2 AB+AG)
HIV 1/2 Antibodies: NONREACTIVE
HIV-1 P24 ANTIGEN - HIV24: NONREACTIVE

## 2016-01-10 MED ORDER — SODIUM CHLORIDE 0.9 % IV SOLN
2.0000 g | Freq: Once | INTRAVENOUS | Status: AC
  Administered 2016-01-10: 2 g via INTRAVENOUS

## 2016-01-10 MED ORDER — ONDANSETRON HCL 4 MG/2ML IJ SOLN: 4.0000 mg | INTRAMUSCULAR | Status: DC | PRN

## 2016-01-10 MED ORDER — OXYCODONE-ACETAMINOPHEN 5-325 MG PO TABS
1.0000 | ORAL_TABLET | ORAL | Status: DC | PRN
Start: 2016-01-10 — End: 2016-01-10

## 2016-01-10 MED ORDER — LACTATED RINGERS IV SOLN: 500.0000 mL | INTRAVENOUS | Status: DC | PRN

## 2016-01-10 MED ORDER — BUTORPHANOL TARTRATE 1 MG/ML IJ SOLN: 1.0000 mg | INTRAMUSCULAR | Status: DC | PRN

## 2016-01-10 MED ORDER — HYDROCODONE-ACETAMINOPHEN 5-325 MG PO TABS
1.0000 | ORAL_TABLET | ORAL | Status: DC | PRN
  Administered 2016-01-10 – 2016-01-11 (×2): 1 via ORAL
  Filled 2016-01-10 (×2): qty 1

## 2016-01-10 MED ORDER — LIDOCAINE HCL (PF) 1 % IJ SOLN: 30.0000 mL | INTRAMUSCULAR | Status: DC | PRN

## 2016-01-10 MED ORDER — ZOLPIDEM TARTRATE 5 MG PO TABS: 5.0000 mg | ORAL_TABLET | Freq: Every evening | ORAL | Status: DC | PRN

## 2016-01-10 MED ORDER — SOD CITRATE-CITRIC ACID 500-334 MG/5ML PO SOLN
30.0000 mL | ORAL | Status: DC | PRN
  Filled 2016-01-10: qty 30

## 2016-01-10 MED ORDER — ACETAMINOPHEN 325 MG PO TABS: 650.0000 mg | ORAL_TABLET | ORAL | Status: DC | PRN

## 2016-01-10 MED ORDER — IBUPROFEN 600 MG PO TABS
600.0000 mg | ORAL_TABLET | Freq: Four times a day (QID) | ORAL | Status: DC
  Administered 2016-01-10 – 2016-01-12 (×9): 600 mg via ORAL
  Filled 2016-01-10 (×11): qty 1

## 2016-01-10 MED ORDER — COCONUT OIL OIL
1.0000 "application " | TOPICAL_OIL | Status: DC | PRN
  Administered 2016-01-12: 1 via TOPICAL
  Filled 2016-01-10: qty 120

## 2016-01-10 MED ORDER — DIBUCAINE 1 % RE OINT
1.0000 | TOPICAL_OINTMENT | RECTAL | Status: DC | PRN
Start: 2016-01-10 — End: 2016-01-12

## 2016-01-10 MED ORDER — ONDANSETRON HCL 4 MG PO TABS: 4.0000 mg | ORAL_TABLET | ORAL | Status: DC | PRN

## 2016-01-10 MED ORDER — SODIUM CHLORIDE 0.9 % IV SOLN
INTRAVENOUS | Status: AC
  Administered 2016-01-10: 2 g via INTRAVENOUS
  Filled 2016-01-10: qty 2000

## 2016-01-10 MED ORDER — TERBUTALINE SULFATE 1 MG/ML IJ SOLN: 0.2500 mg | Freq: Once | INTRAMUSCULAR | Status: DC | PRN

## 2016-01-10 MED ORDER — PRENATAL MULTIVITAMIN CH
1.0000 | ORAL_TABLET | Freq: Every day | ORAL | Status: DC
  Administered 2016-01-10 – 2016-01-12 (×3): 1 via ORAL
  Filled 2016-01-10 (×3): qty 1

## 2016-01-10 MED ORDER — SENNOSIDES-DOCUSATE SODIUM 8.6-50 MG PO TABS: 2.0000 | ORAL_TABLET | ORAL | Status: DC

## 2016-01-10 MED ORDER — IBUPROFEN 600 MG PO TABS
600.0000 mg | ORAL_TABLET | Freq: Four times a day (QID) | ORAL | Status: DC
  Administered 2016-01-10: 600 mg via ORAL
  Filled 2016-01-10: qty 1

## 2016-01-10 MED ORDER — LACTATED RINGERS IV SOLN
INTRAVENOUS | Status: DC
  Administered 2016-01-10: 03:00:00 via INTRAVENOUS

## 2016-01-10 MED ORDER — BENZOCAINE-MENTHOL 20-0.5 % EX AERO: 1.0000 "application " | INHALATION_SPRAY | CUTANEOUS | Status: DC | PRN

## 2016-01-10 MED ORDER — OXYCODONE-ACETAMINOPHEN 5-325 MG PO TABS: 2.0000 | ORAL_TABLET | ORAL | Status: DC | PRN

## 2016-01-10 MED ORDER — WITCH HAZEL-GLYCERIN EX PADS: 1.0000 "application " | MEDICATED_PAD | CUTANEOUS | Status: DC | PRN

## 2016-01-10 MED ORDER — SIMETHICONE 80 MG PO CHEW: 80.0000 mg | CHEWABLE_TABLET | ORAL | Status: DC | PRN

## 2016-01-10 MED ORDER — OXYTOCIN 40 UNITS IN LACTATED RINGERS INFUSION - SIMPLE MED
INTRAVENOUS | Status: AC
  Administered 2016-01-10: 500 mL via INTRAVENOUS
  Filled 2016-01-10: qty 1000

## 2016-01-10 MED ORDER — OXYTOCIN BOLUS FROM INFUSION
500.0000 mL | Freq: Once | INTRAVENOUS | Status: AC
  Administered 2016-01-10: 500 mL via INTRAVENOUS

## 2016-01-10 MED ORDER — ONDANSETRON HCL 4 MG/2ML IJ SOLN: 4.0000 mg | Freq: Four times a day (QID) | INTRAMUSCULAR | Status: DC | PRN

## 2016-01-10 MED ORDER — DIPHENHYDRAMINE HCL 25 MG PO CAPS: 25.0000 mg | ORAL_CAPSULE | Freq: Four times a day (QID) | ORAL | Status: DC | PRN

## 2016-01-10 MED ORDER — OXYTOCIN 40 UNITS IN LACTATED RINGERS INFUSION - SIMPLE MED
2.5000 [IU]/h | INTRAVENOUS | Status: DC
  Administered 2016-01-10: 2.5 [IU]/h via INTRAVENOUS

## 2016-01-10 MED ORDER — OXYTOCIN 40 UNITS IN LACTATED RINGERS INFUSION - SIMPLE MED: 1.0000 m[IU]/min | INTRAVENOUS | Status: DC

## 2016-01-10 NOTE — OB Triage Note (Signed)
Patient arrived to triage with c/o contractions since approx 0030 this morning. States positive fetal movement. Denies leaking of fluid. States some "spotting".  EFM applied. Discussed plan of care. Patient verbalized understanding.

## 2016-01-10 NOTE — Lactation Note (Signed)
This note was copied from a baby's chart. Lactation Consultation Note  Patient Name: Yvonne Morrison [id number] Date: 01/10/2016     Maternal Data    Feeding Feeding Type: Formula Nipple Type: Slow - flow  LATCH Score/Interventions                      Lactation Tools Discussed/Used     Consult Status  LC did an oral evaluation and baby does not appear to have a normal suck or swallow function. No tongue or lip-tie seen, no high palate. Color is normal. Tried to do a finger feeding with curved tip syringe and had to tilt baby back in order to get him to swallow. Placed nipple shield on mom with formula in shield for enticement, baby did did 3 small tugs (sucks) and let formula fall down the side of his face.   Had RN refer to Feeding Team [place] 01/10/2016, 3:15 PM

## 2016-01-10 NOTE — H&P (Signed)
Obstetric History and Physical  Yvonne Morrison is a 20 y.o. G2P0101 with IUP at [redacted]w[redacted]d with h/o cholestasis in pregnancy who was scheduled for IOL later this morning presenting for contractions since 0030. Patient states she has been having  regular, every 2-3 minutes contractions, minimal vaginal bleeding, intact membranes, with active fetal movement.    Prenatal Course Source of Care: [facility] with onset of care at 9 weeks Pregnancy complications or risks: Patient Active Problem List   Diagnosis Date Noted  . Cholestasis during pregnancy in third trimester 01/10/2016  . Oligohydramnios antepartum, unspecified trimester, not applicable or unspecified fetus 01/08/2016  . Abdominal pain affecting pregnancy 12/13/2015  . Anxiety 09/08/2015  . Panic attack as reaction to stress 09/08/2015  . Supervision of normal pregnancy 08/09/2015  . Maternal renal lithiasis, history of   . Cholestasis of pregnancy 10/20/2014  . Hemorrhoids during pregnancy 10/20/2014   She plans to breastfeed She desires undecided method for postpartum contraception.   Prenatal labs and studies: ABO, Rh: --/--/PENDING (10/19 0236) Antibody: PENDING (10/19 0236) Rubella: 14.10 (10/04 1454) RPR: Non Reactive (04/07 1547)  HBsAg: Negative (04/07 1547)  HIV: Non Reactive (04/07 1547)  GBS: Pending 1 hr Glucola  Normal (68) Genetic screening normal Anatomy US normal   Past Medical History:  Diagnosis Date  . Maternal renal lithiasis, history of    2015, during 1st pregnancy  . Rectal bleeding     Past Surgical History:  Procedure Laterality Date  . NO PAST SURGERIES      OB History  Gravida Para Term Preterm AB Living  2 1 0 1   1  SAB TAB Ectopic Multiple Live Births          1    # Outcome Date GA Lbr Len/2nd Weight Sex Delivery Anes PTL Lv  2 Current           1 Preterm 03/07/14 [redacted]w[redacted]d  4 lb 1.9 oz (1.869 kg) F Vag-Spont   LIV    Obstetric Comments  History of cholestasis of pregnancy  at 28 weeks  History of renal lithiasis    Social History   Social History  . Marital status: Married    Spouse name: N/A  . Number of children: N/A  . Years of education: N/A   Social History Main Topics  . Smoking status: Never Smoker  . Smokeless tobacco: Never Used  . Alcohol use No  . Drug use: No  . Sexual activity: Yes    Partners: Male    Birth control/ protection: None   Other Topics Concern  . None   Social History Narrative  . None    Family History  Problem Relation Age of Onset  . Diabetes Father   . Prostate cancer Father   . Cancer Neg Hx   . Heart disease Neg Hx     Prescriptions Prior to Admission  Medication Sig Dispense Refill Last Dose  . Prenat w/o A Vit-FeFum-FePo-FA (PROVIDA OB) 20-20-1.25 MG CAPS Take 20 mg by mouth daily. 30 capsule 12 01/09/2016 at 2100  . ursodiol (ACTIGALL) 300 MG capsule Take 1 capsule (300 mg total) by mouth 3 (three) times daily. 60 capsule 1 01/09/2016 at 2230  . hydrOXYzine (ATARAX/VISTARIL) 25 MG tablet Take 1 tablet (25 mg total) by mouth every 6 (six) hours as needed for itching. (Patient not taking: Reported on 01/10/2016) 30 tablet 2 Not Taking at Unknown time    No Known Allergies  Review of Systems: Negative  except for what is mentioned in HPI.  Physical Exam: BP 125/86 (BP Location: Left Arm)   Pulse 81   Temp 97.4 F (36.3 C) (Oral)   Resp 18   Ht 5\' 6"  (1.676 m)   Wt 156 lb (70.8 kg)   LMP 04/24/2015 (Exact Date)   BMI 25.18 kg/m  CONSTITUTIONAL: Well-developed, well-nourished female in no acute distress.  HENT:  Normocephalic, atraumatic, External right and left ear normal. Oropharynx is clear and moist EYES: Conjunctivae and EOM are normal. Pupils are equal, round, and reactive to light. No scleral icterus.  NECK: Normal range of motion, supple, no masses SKIN: Skin is warm and dry. No rash noted. Not diaphoretic. No erythema. No pallor. NEUROLOGIC: Alert and oriented to person, place, and  time. Normal reflexes, muscle tone coordination. No cranial nerve deficit noted. PSYCHIATRIC: Normal mood and affect. Normal behavior. Normal judgment and thought content. CARDIOVASCULAR: Normal heart rate noted, regular rhythm RESPIRATORY: Effort and breath sounds normal, no problems with respiration noted ABDOMEN: Soft, nontender, nondistended, gravid. MUSCULOSKELETAL: Normal range of motion. No edema and no tenderness. 2+ distal pulses.  Cervical Exam: Dilatation 9-10 cm   Effacement 90%   Station 0   Presentation: cephalic FHT:  Baseline rate 140 bpm   Variability moderate  Accelerations present   Decelerations variable Contractions: Every 2-3 mins   Pertinent Labs/Studies:   Results for orders placed or performed during the hospital encounter of 01/10/16 (from the past 24 hour(s))  CBC     Status: Abnormal   Collection Time: 01/10/16  2:36 AM  Result Value Ref Range   WBC 12.1 (H) 3.6 - 11.0 K/uL   RBC 3.65 (L) 3.80 - 5.20 MIL/uL   Hemoglobin 11.4 (L) 12.0 - 16.0 g/dL   HCT 33.5 (L) 35.0 - 47.0 %   MCV 91.8 80.0 - 100.0 fL   MCH 31.2 26.0 - 34.0 pg   MCHC 33.9 32.0 - 36.0 g/dL   RDW 12.5 11.5 - 14.5 %   Platelets 238 150 - 440 K/uL  Type and screen [place]     Status: None (Preliminary result)   Collection Time: 01/10/16  2:36 AM  Result Value Ref Range   ABO/RH(D) PENDING    Antibody Screen PENDING    Sample Expiration 01/13/2016     Assessment : Yvonne Morrison is a 20 y.o. G2P0101 at [redacted]w[redacted]d being admitted for labor, cholestasis of pregnancy.  Plan: Labor: Expectant management.   FWB: Category II tracing.  GBS unknown, will treat with ampicillin.  Delivery plan: Hopeful for vaginal delivery soon   [name], MD [facility]

## 2016-01-11 LAB — CBC
HEMATOCRIT: 31.4 % — AB (ref 35.0–47.0)
HEMOGLOBIN: 10.7 g/dL — AB (ref 12.0–16.0)
MCH: 31.6 pg (ref 26.0–34.0)
MCHC: 34.1 g/dL (ref 32.0–36.0)
MCV: 92.8 fL (ref 80.0–100.0)
Platelets: 195 10*3/uL (ref 150–440)
RBC: 3.38 MIL/uL — AB (ref 3.80–5.20)
RDW: 12.8 % (ref 11.5–14.5)
WBC: 10.6 10*3/uL (ref 3.6–11.0)

## 2016-01-11 LAB — SURGICAL PATHOLOGY

## 2016-01-11 LAB — RPR: RPR Ser Ql: NONREACTIVE

## 2016-01-11 MED ORDER — BREAST MILK
ORAL | Status: DC
  Filled 2016-01-11: qty 1

## 2016-01-12 MED ORDER — IBUPROFEN 600 MG PO TABS: 600.0000 mg | ORAL_TABLET | Freq: Four times a day (QID) | ORAL | 0 refills | Status: DC

## 2016-01-12 MED ORDER — IBUPROFEN 800 MG PO TABS: 800.0000 mg | ORAL_TABLET | Freq: Three times a day (TID) | ORAL | 1 refills | Status: DC

## 2016-01-12 NOTE — Discharge Summary (Signed)
Physician Obstetric Discharge Summary  Patient ID: Yvonne Morrison MRN: [id number] DOB/AGE: 21-27-1997 21 y.o.   Date of Admission: 01/10/2016  Date of Discharge:   Admitting Diagnosis: Onset of Labor at [redacted]w[redacted]d  Secondary Diagnosis: Cholestasis of Pregnancy  Mode of Delivery: normal spontaneous vaginal delivery     Discharge Diagnosis: 36 week IOUP delivered; Cholestasis   Intrapartum Procedures: none   Post partum procedures: none  Complications: none   Brief Hospital Course  Yvonne Morrison is a [id number] who had a SVD on 01/10/16;  for further details of this delivery, please refer to the delivey note.  Patient had an uncomplicated postpartum course.  By time of discharge on PPD#2, her pain was controlled on oral pain medications; she had appropriate lochia and was ambulating, voiding without difficulty and tolerating regular diet.  She was deemed stable for discharge to home.    Labs: CBC Latest Ref Rng & Units 01/11/2016 01/10/2016 12/26/2015  WBC 3.6 - 11.0 K/uL 10.6 12.1(H) WILL FOLLOW  Hemoglobin 12.0 - 16.0 g/dL 10.7(L) 11.4(L) -  Hematocrit 35.0 - 47.0 % 31.4(L) 33.5(L) WILL FOLLOW  Platelets 150 - 440 K/uL 195 238 WILL FOLLOW   O POS  Physical exam:  Blood pressure (!) 110/59, pulse 78, temperature 97.9 F (36.6 C), temperature source Oral, resp. rate 18, height 5\' 6"  (1.676 m), weight 156 lb (70.8 kg), last menstrual period 04/24/2015, SpO2 100 %, unknown if currently breastfeeding. General: alert and no distress Lochia: appropriate Abdomen: soft, NT Uterine Fundus: firm   Discharge Instructions: Per After Visit Summary. Activity: Advance as tolerated. Pelvic rest for 6 weeks.  Also refer to After Visit Summary Diet: Regular Medications:   Medication List    STOP taking these medications   ursodiol 300 MG capsule Commonly known as:  ACTIGALL     TAKE these medications   hydrOXYzine 25 MG tablet Commonly known as:  ATARAX/VISTARIL Take 1 tablet (25 mg total)  by mouth every 6 (six) hours as needed for itching.   ibuprofen 600 MG tablet Commonly known as:  ADVIL,MOTRIN Take 1 tablet (600 mg total) by mouth every 6 (six) hours.   ibuprofen 800 MG tablet Commonly known as:  ADVIL,MOTRIN Take 1 tablet (800 mg total) by mouth 3 (three) times daily.   PROVIDA OB 20-20-1.25 MG Caps Take 20 mg by mouth daily.      Outpatient follow up:  Postpartum contraception: condoms  Discharged Condition: good  Discharged to: home   Newborn Data: Disposition:NICU  Apgars: APGAR (1 MIN): 8   APGAR (5 MINS): 9   APGAR (10 MINS):    Baby Feeding: Breast  Brayton Mars, MD

## 2016-01-12 NOTE — Progress Notes (Signed)
Patient discharged but will be rooming in with infant. . Discharge instructions and prescriptions given to and reviewed with patient and significant other. Patient verbalized understanding.

## 2016-01-14 ENCOUNTER — Encounter: Payer: Self-pay | Admitting: Obstetrics and Gynecology

## 2016-02-21 ENCOUNTER — Encounter: Payer: Self-pay | Admitting: Obstetrics and Gynecology

## 2016-02-21 ENCOUNTER — Telehealth: Payer: Self-pay | Admitting: Obstetrics and Gynecology

## 2016-02-21 ENCOUNTER — Ambulatory Visit (INDEPENDENT_AMBULATORY_CARE_PROVIDER_SITE_OTHER): Payer: Medicaid Other | Admitting: Obstetrics and Gynecology

## 2016-02-21 DIAGNOSIS — O9081 Anemia of the puerperium: Secondary | ICD-10-CM

## 2016-02-21 DIAGNOSIS — K831 Obstruction of bile duct: Secondary | ICD-10-CM

## 2016-02-21 DIAGNOSIS — Z30016 Encounter for initial prescription of transdermal patch hormonal contraceptive device: Secondary | ICD-10-CM

## 2016-02-21 DIAGNOSIS — O26613 Liver and biliary tract disorders in pregnancy, third trimester: Secondary | ICD-10-CM

## 2016-02-21 DIAGNOSIS — F419 Anxiety disorder, unspecified: Secondary | ICD-10-CM

## 2016-02-21 MED ORDER — NORELGESTROMIN-ETH ESTRADIOL 150-35 MCG/24HR TD PTWK: 1.0000 | MEDICATED_PATCH | TRANSDERMAL | 12 refills | Status: DC

## 2016-02-21 NOTE — Progress Notes (Signed)
   OBSTETRICS POSTPARTUM CLINIC PROGRESS NOTE  Subjective:     Yvonne Morrison is a 20 y.o. [phone] female who presents for a postpartum visit. She is 7 weeks postpartum following a spontaneous vaginal delivery. I have fully reviewed the prenatal and intrapartum course. The delivery was at 36.6 gestational weeks, complicated by cholestasis of pregnancy.  Anesthesia: none. Postpartum course has been well. Baby's course has been well. Baby is feeding by both breast and bottle - Similac Advance. Bleeding: patient has not resumed menses, with No LMP recorded.. Bowel function is normal. Bladder function is normal. Patient is not sexually active. Contraception method desired is Public relations account executive weekly. Postpartum depression screening: negative.  The following portions of the patient's history were reviewed and updated as appropriate: allergies, current medications, past family history, past medical history, past social history, past surgical history and problem list.  Review of Systems A comprehensive review of systems was negative except for: Behavioral/Psych: positive for anxiety  (notes that it is getting a little worse)  Objective:    BP 96/61 (BP Location: Left Arm, Patient Position: Sitting, Cuff Size: Normal)   Pulse 91   Ht 5\' 4"  (1.626 m)   Wt 142 lb 3.2 oz (64.5 kg)   Breastfeeding? Yes   BMI 24.41 kg/m   General:  alert and no distress   Breasts:  inspection negative, no nipple discharge or bleeding, no masses or nodularity palpable  Lungs: clear to auscultation bilaterally  Heart:  regular rate and rhythm, S1, S2 normal, no murmur, click, rub or gallop  Abdomen: soft, non-tender; bowel sounds normal; no masses,  no organomegaly.     Vulva:  normal  Vagina: normal vagina, no discharge, exudate, lesion, or erythema  Cervix:  no cervical motion tenderness and no lesions  Corpus: normal size, contour, position, consistency, mobility, non-tender  Adnexa:  normal adnexa and no mass,  fullness, tenderness  Rectal Exam: Not performed.         Labs:  Lab Results  Component Value Date   HGB 10.7 (L) 01/11/2016    Assessment:    Routine postpartum exam.    H/o cholestasis in pregnancy  Mild postpartum anemia Anxiety  Plan:    1. Contraception: Ortho-Evra patches weekly 2. Can resume all normal activities.  3. Mild anemia, no interventions as Hgb above 10 at time of discharge.  4. Anxiety - pt has a therapist, has appt next week.  Can use Vestaril (previously prescribed for itching during pregnancy due to cholestasis) for anxiety as well.  5. Follow up in: 4 months for annual exam, or as needed.     [name], MD [facility]

## 2016-02-21 NOTE — Telephone Encounter (Signed)
BC not at pharmacy ([facility] [address]) Dr Marcelline Mates was supposed to sent there and its not there.

## 2016-02-21 NOTE — Telephone Encounter (Signed)
RX was sent previously, will send again.

## 2016-06-10 ENCOUNTER — Other Ambulatory Visit: Payer: Self-pay

## 2016-06-10 ENCOUNTER — Encounter: Payer: Self-pay | Admitting: Obstetrics and Gynecology

## 2016-06-10 DIAGNOSIS — B3731 Acute candidiasis of vulva and vagina: Secondary | ICD-10-CM

## 2016-06-10 DIAGNOSIS — B373 Candidiasis of vulva and vagina: Secondary | ICD-10-CM

## 2016-06-10 MED ORDER — TERCONAZOLE 0.4 % VA CREA: 1.0000 | TOPICAL_CREAM | Freq: Every day | VAGINAL | 0 refills | Status: DC

## 2016-06-19 ENCOUNTER — Encounter: Payer: Medicaid Other | Admitting: Obstetrics and Gynecology

## 2016-06-26 ENCOUNTER — Encounter: Payer: Medicaid Other | Admitting: Obstetrics and Gynecology

## 2016-06-27 ENCOUNTER — Encounter: Payer: Self-pay | Admitting: Obstetrics and Gynecology

## 2016-06-27 ENCOUNTER — Ambulatory Visit (INDEPENDENT_AMBULATORY_CARE_PROVIDER_SITE_OTHER): Payer: Medicaid Other | Admitting: Obstetrics and Gynecology

## 2016-06-27 VITALS — BP 95/63 | HR 81 | Ht 64.0 in | Wt 145.9 lb

## 2016-06-27 DIAGNOSIS — F419 Anxiety disorder, unspecified: Secondary | ICD-10-CM

## 2016-06-27 DIAGNOSIS — Z01419 Encounter for gynecological examination (general) (routine) without abnormal findings: Secondary | ICD-10-CM

## 2016-06-27 DIAGNOSIS — Z862 Personal history of diseases of the blood and blood-forming organs and certain disorders involving the immune mechanism: Secondary | ICD-10-CM

## 2016-06-27 DIAGNOSIS — K831 Obstruction of bile duct: Secondary | ICD-10-CM

## 2016-06-27 DIAGNOSIS — Z3009 Encounter for other general counseling and advice on contraception: Secondary | ICD-10-CM | POA: Diagnosis not present

## 2016-06-27 DIAGNOSIS — L659 Nonscarring hair loss, unspecified: Secondary | ICD-10-CM

## 2016-06-27 MED ORDER — HYDROXYZINE PAMOATE 100 MG PO CAPS: 100.0000 mg | ORAL_CAPSULE | Freq: Three times a day (TID) | ORAL | 3 refills | Status: DC | PRN

## 2016-06-27 NOTE — Patient Instructions (Addendum)
Preventive Care for Young Adults, Female The transition to life after high school as a young adult can be a stressful time with many changes. You may start seeing a primary care physician instead of a pediatrician. This is the time when your health care becomes your responsibility. Preventive care refers to lifestyle choices and visits with your health care provider that can promote health and wellness. What does preventive care include?  A yearly physical exam. This is also called an annual wellness visit.  Dental exams once or twice a year.  Routine eye exams. Ask your health care provider how often you should have your eyes checked.  Personal lifestyle choices, including: ? Daily care of your teeth and gums. ? Regular physical activity. ? Eating a healthy diet. ? Avoiding tobacco and drug use. ? Avoiding or limiting alcohol use. ? Practicing safe sex. ? Taking vitamin and mineral supplements as recommended by your health care provider. What happens during an annual wellness visit? Preventive care starts with a yearly visit to your primary care physician. The services and screenings done by your health care provider during your annual wellness visit will depend on your overall health, lifestyle risk factors, and family history of disease. Counseling Your health care provider may ask you questions about:  Past medical problems and your family's medical history.  Medicines or supplements you take.  Health insurance and access to health care.  Alcohol, tobacco, and drug use.  Your safety at home, work, or school.  Access to firearms.  Emotional well-being and how you cope with stress.  Relationship well-being.  Diet, exercise, and sleep habits.  Your sexual health and activity.  Your methods of birth control.  Your menstrual cycle.  Your pregnancy history.  Screening You may have the following tests or measurements:  Height, weight, and BMI.  Blood  pressure.  Lipid and cholesterol levels.  Tuberculosis skin test.  Skin exam.  Vision and hearing tests.  Screening test for hepatitis.  Screening tests for sexually transmitted diseases (STDs), if you are at risk.  BRCA-related cancer screening. This may be done if you have a family history of breast, ovarian, tubal, or peritoneal cancers.  Pelvic exam and Pap test. This may be done every 3 years starting at age 21.  Vaccines Your health care provider may recommend certain vaccines, such as:  Influenza vaccine. This is recommended every year.  Tetanus, diphtheria, and acellular pertussis (Tdap, Td) vaccine. You may need a Td booster every 10 years.  Varicella vaccine. You may need this if you have not been vaccinated.  HPV vaccine. If you are 26 or younger, you may need three doses over 6 months.  Measles, mumps, and rubella (MMR) vaccine. You may need at least one dose of MMR. You may also need a second dose.  Pneumococcal 13-valent conjugate (PCV13) vaccine. You may need this if you have certain conditions and were not previously vaccinated.  Pneumococcal polysaccharide (PPSV23) vaccine. You may need one or two doses if you smoke cigarettes or if you have certain conditions.  Meningococcal vaccine. One dose is recommended if you are age 19-21 years and a first-year college student living in a residence hall, or if you have one of several medical conditions. You may also need additional booster doses.  Hepatitis A vaccine. You may need this if you have certain conditions or if you travel or work in places where you may be exposed to hepatitis A.  Hepatitis B vaccine. You may need this if   you have certain conditions or if you travel or work in places where you may be exposed to hepatitis B.  Haemophilus influenzae type b (Hib) vaccine. You may need this if you have certain risk factors.  Talk to your health care provider about which screenings and vaccines you need and how  often you need them. What steps can I take to develop healthy behaviors?  Have regular preventive health care visits with your primary care physician and dentist.  Eat a healthy diet.  Drink enough fluid to keep your urine clear or pale yellow.  Stay active. Exercise at least 30 minutes 5 or more days of the week.  Use alcohol responsibly.  Maintain a healthy weight.  Do not use any products that contain nicotine, such as cigarettes, chewing tobacco, and e-cigarettes. If you need help quitting, ask your health care provider.  Do not use drugs.  Practice safe sex.  Use birth control (contraception) to prevent unwanted pregnancy. If you plan to become pregnant, see your health care provider for a pre-conception visit.  Find healthy ways to manage stress. How can I protect myself from injury? Injuries from violence or accidents are the leading cause of death among young adults and can often be prevented. Take these steps to help protect yourself:  Always wear your seat belt while driving or riding in a vehicle.  Do not drive if you have been drinking alcohol. Do not ride with someone who has been drinking.  Do not drive when you are tired or distracted. Do not text while driving.  Wear a helmet and other protective equipment during sports activities.  If you have firearms in your house, make sure you follow all gun safety procedures.  Seek help if you have been bullied, physically abused, or sexually abused.  Use the Internet responsibly to avoid dangers such as online bullying and online sexual predators.  What can I do to cope with stress? Young adults may face many new challenges that can be stressful, such as finding a job, going to college, moving away from home, managing money, being in a relationship, getting married, and having children. To manage stress:  Avoid known stressful situations when you can.  Exercise regularly.  Find a stress-reducing activity that  works best for you. Examples include meditation, yoga, listening to music, or reading.  Spend time in nature.  Keep a journal to write about your stress and how you respond.  Talk to your health care provider about stress. He or she may suggest counseling.  Spend time with supportive friends or family.  Do not cope with stress by: ? Drinking alcohol or using drugs. ? Smoking cigarettes. ? Eating.  Where can I get more information? Learn more about preventive care and healthy habits from:  American College of Obstetricians and Gynecologists: www.acog.org/Patients  U.S. Preventive Services Task Force: www.uspreventiveservicestaskforce.org/Tools/ConsumerInfo/Index/information-for-consumers  National Adolescent and Young Adult Health Information Center: http://nahic.ucsf.edu/resource-center/  American Academy of Pediatrics Bright Futures: https://brightfutures.aap.org  Society for Adolescent Health and Medicine: www.adolescenthealth.org/Resources/Clinical-Care-Resources/Mental-Health/Mental-Health-Resources-For-Adolesc.aspx  HealthCare.gov: www.healthcare.gov/young-adults/coverage/  This information is not intended to replace advice given to you by your health care provider. Make sure you discuss any questions you have with your health care provider. Document Released: 07/26/2015 Document Revised: 08/16/2015 Document Reviewed: 07/26/2015 Elsevier Interactive Patient Education  2017 [employer]  

## 2016-06-27 NOTE — Progress Notes (Signed)
GYNECOLOGY ANNUAL PHYSICAL EXAM PROGRESS NOTE  Subjective:    Mekala Winger is a 21 y.o. [phone] female who presents for an annual exam. The patient is sexually active. The patient wears seatbelts: yes. The patient participates in regular exercise: no. Has the patient ever been transfused or tattooed?: no. The patient reports that there is not domestic violence in her life.   The patient has the following complaints today.   1.  Notes that she is beginning to have the same type of itching she had during her pregnancy that was complicated by cholestasis.   2. Notes anxiety is increasing.  3. Notes moderate hair loss.   Gynecologic History Menarche age: 12 Patient's last menstrual period was 06/26/2016. Period Cycle (Days): 28 Period Duration (Days): 7 Period Pattern: Regular Menstrual Flow: Moderate, Heavy Dysmenorrhea: (!) Mild Dysmenorrhea Symptoms: Cramping  Contraception: Ortho-Evra patches weekly History of STI's:  Denies Last Pap: 07/12/2015. Results were: normal.  Denies h/o abnormal pap smears.   Obstetric History   G2   P2   T0   P2   A0   L2    SAB0   TAB0   Ectopic0   Multiple0   Live Births2     # Outcome Date GA Lbr Len/2nd Weight Sex Delivery Anes PTL Lv  2 Preterm 01/10/16 [redacted]w[redacted]d 02:18 / 00:44 5 lb 3.8 oz (2.376 kg) M Vag-Spont None  LIV     Name: Sturges,PENDINGBABY     Apgar1:  8                Apgar5: 9  1 Preterm 03/07/14 [redacted]w[redacted]d  4 lb 1.9 oz (1.869 kg) F Vag-Spont   LIV    Obstetric Comments  History of cholestasis of pregnancy at 28 weeks  History of renal lithiasis    Past Medical History:  Diagnosis Date  . Maternal renal lithiasis, history of    2015, during 1st pregnancy  . Rectal bleeding     Past Surgical History:  Procedure Laterality Date  . NO PAST SURGERIES      Family History  Problem Relation Age of Onset  . Diabetes Father   . Prostate cancer Father   . Cancer Neg Hx   . Heart disease Neg Hx     Social History   Social  History  . Marital status: Married    Spouse name: N/A  . Number of children: N/A  . Years of education: N/A   Occupational History  . Not on file.   Social History Main Topics  . Smoking status: Never Smoker  . Smokeless tobacco: Never Used  . Alcohol use No  . Drug use: No  . Sexual activity: Yes    Partners: Male    Birth control/ protection: Patch   Other Topics Concern  . Not on file   Social History Narrative  . No narrative on file    Current Outpatient Prescriptions on File Prior to Visit  Medication Sig Dispense Refill  . norelgestromin-ethinyl estradiol (ORTHO EVRA) 150-35 MCG/24HR transdermal patch Place 1 patch onto the skin once a week. 3 patch 12   No current facility-administered medications on file prior to visit.     No Known Allergies   Review of Systems Constitutional: negative for chills, fatigue, fevers and sweats Eyes: negative for irritation, redness and visual disturbance Ears, nose, mouth, throat, and face: negative for hearing loss, nasal congestion, snoring and tinnitus Respiratory: negative for asthma, cough, sputum Cardiovascular: negative for chest pain,  dyspnea, exertional chest pressure/discomfort, irregular heart beat, palpitations and syncope Gastrointestinal: negative for abdominal pain, change in bowel habits, nausea and vomiting Genitourinary: negative for abnormal menstrual periods, genital lesions, sexual problems and vaginal discharge, dysuria and urinary incontinence Integument/breast: negative for breast lump, breast tenderness and nipple discharge.  Positive for hair loss and itchy skin.  Hematologic/lymphatic: negative for bleeding and easy bruising Musculoskeletal:negative for back pain and muscle weakness Neurological: negative for dizziness, headaches, vertigo and weakness Endocrine: negative for diabetic symptoms including polydipsia, polyuria and skin dryness Allergic/Immunologic: negative for hay fever and urticaria         Objective:  Blood pressure 95/63, pulse 81, height 5\' 4"  (1.626 m), weight 145 lb 14.4 oz (66.2 kg), last menstrual period 06/26/2016, currently breastfeeding. Body mass index is 25.04 kg/m.   General Appearance:    Alert, cooperative, no distress, appears stated age  Head:    Normocephalic, without obvious abnormality, atraumatic  Eyes:    PERRL, conjunctiva/corneas clear, EOM's intact, both eyes  Ears:    Normal external ear canals, both ears  Nose:   Nares normal, septum midline, mucosa normal, no drainage or sinus tenderness  Throat:   Lips, mucosa, and tongue normal; teeth and gums normal  Neck:   Supple, symmetrical, trachea midline, no adenopathy; thyroid: no enlargement/tenderness/nodules; no carotid bruit or JVD  Back:     Symmetric, no curvature, ROM normal, no CVA tenderness  Lungs:     Clear to auscultation bilaterally, respirations unlabored  Chest Wall:    No tenderness or deformity   Heart:    Regular rate and rhythm, S1 and S2 normal, no murmur, rub or gallop  Breast Exam:    No tenderness, masses, or nipple abnormality  Abdomen:     Soft, non-tender, bowel sounds active all four quadrants, no masses, no organomegaly.    Genitalia:    Pelvic:external genitalia normal, vagina without lesions, discharge, or tenderness, rectovaginal septum  normal. Cervix normal in appearance, no cervical motion tenderness, no adnexal masses or tenderness.  Uterus normal size, shape, mobile, regular contours, nontender.  Rectal:    Normal external sphincter.  No hemorrhoids appreciated. Internal exam not done.   Extremities:   Extremities normal, atraumatic, no cyanosis or edema  Pulses:   2+ and symmetric all extremities  Skin:   Skin color, texture, turgor normal, no rashes or lesions  Lymph nodes:   Cervical, supraclavicular, and axillary nodes normal  Neurologic:   CNII-XII intact, normal strength, sensation and reflexes throughout    Labs:  Lab Results  Component Value Date    WBC 10.6 01/11/2016   HGB 10.7 (L) 01/11/2016   HCT 31.4 (L) 01/11/2016   MCV 92.8 01/11/2016   PLT 195 01/11/2016    Lab Results  Component Value Date   CREATININE 0.53 12/13/2015   BUN 8 12/13/2015   NA 136 12/13/2015   K 3.5 12/13/2015   CL 107 12/13/2015   CO2 21 (L) 12/13/2015    Lab Results  Component Value Date   ALT 82 (H) 01/02/2016   AST 50 (H) 01/02/2016   ALKPHOS 230 (H) 01/02/2016   BILITOT 0.3 01/02/2016    No results found for: TSH   Assessment:    Healthy female exam.   Hair loss H/o cholestasis in pregnancy Anxiety Contraception counseling H/o anemia postpartum Plan:     Blood tests: CBC with diff, Comprehensive metabolic panel and bile acids. Breast self exam technique reviewed and patient encouraged to perform self-exam monthly.  Contraception: Ortho-Evra patches weekly.  Patient wonders if this could be a cause of her hair loss.  Discussed that contraception can be a cause of hair loss, if she is especially sensitive to hormones.  Also discussed normal postpartum hair loss.  Patient would like to consider alternative form of contraception but unsure of type. Patient given handout on all options, briefly discussed.  To f/u once decision is made.  Discussed healthy lifestyle modifications. Anxiety, will prescribe Atarax, to help with anxiety as well as itching.  Follow up in 1 year, or sooner as needed if symptoms worsen.     [name], MD [facility]

## 2016-06-29 LAB — BILE ACIDS, TOTAL: Bile Acids Total: 3.1 umol/L — ABNORMAL LOW (ref 4.7–24.5)

## 2016-06-29 LAB — COMPREHENSIVE METABOLIC PANEL
A/G RATIO: 1.1 — AB (ref 1.2–2.2)
ALT: 9 IU/L (ref 0–32)
AST: 15 IU/L (ref 0–40)
Albumin: 4 g/dL (ref 3.5–5.5)
Alkaline Phosphatase: 74 IU/L (ref 39–117)
BUN/Creatinine Ratio: 15 (ref 9–23)
BUN: 11 mg/dL (ref 6–20)
Bilirubin Total: 0.3 mg/dL (ref 0.0–1.2)
CALCIUM: 9.1 mg/dL (ref 8.7–10.2)
CO2: 21 mmol/L (ref 18–29)
CREATININE: 0.71 mg/dL (ref 0.57–1.00)
Chloride: 101 mmol/L (ref 96–106)
GFR, EST AFRICAN AMERICAN: 140 mL/min/{1.73_m2} (ref >59)
GFR, EST NON AFRICAN AMERICAN: 121 mL/min/{1.73_m2} (ref >59)
Globulin, Total: 3.6 g/dL (ref 1.5–4.5)
Glucose: 86 mg/dL (ref 65–99)
POTASSIUM: 4 mmol/L (ref 3.5–5.2)
Sodium: 141 mmol/L (ref 134–144)
TOTAL PROTEIN: 7.6 g/dL (ref 6.0–8.5)

## 2016-06-29 LAB — CBC
HEMATOCRIT: 34.8 % (ref 34.0–46.6)
HEMOGLOBIN: 11.2 g/dL (ref 11.1–15.9)
MCH: 28.6 pg (ref 26.6–33.0)
MCHC: 32.2 g/dL (ref 31.5–35.7)
MCV: 89 fL (ref 79–97)
Platelets: 281 10*3/uL (ref 150–379)
RBC: 3.91 x10E6/uL (ref 3.77–5.28)
RDW: 13.5 % (ref 12.3–15.4)
WBC: 6 10*3/uL (ref 3.4–10.8)

## 2016-06-29 LAB — PLEASE NOTE

## 2016-12-02 ENCOUNTER — Ambulatory Visit (INDEPENDENT_AMBULATORY_CARE_PROVIDER_SITE_OTHER): Payer: Medicaid Other | Admitting: Obstetrics and Gynecology

## 2016-12-02 ENCOUNTER — Encounter: Payer: Self-pay | Admitting: Obstetrics and Gynecology

## 2016-12-02 VITALS — BP 103/67 | HR 94 | Ht 64.0 in | Wt 149.1 lb

## 2016-12-02 DIAGNOSIS — Z8719 Personal history of other diseases of the digestive system: Secondary | ICD-10-CM

## 2016-12-02 DIAGNOSIS — Z8759 Personal history of other complications of pregnancy, childbirth and the puerperium: Secondary | ICD-10-CM | POA: Diagnosis not present

## 2016-12-02 DIAGNOSIS — N912 Amenorrhea, unspecified: Secondary | ICD-10-CM | POA: Diagnosis not present

## 2016-12-02 HISTORY — DX: Personal history of other diseases of the digestive system: Z87.19

## 2016-12-02 LAB — POCT URINE PREGNANCY: Preg Test, Ur: NEGATIVE

## 2016-12-02 NOTE — Patient Instructions (Signed)
1. Urine pregnancy test today is negative 2. Recommend multivitamin or prenatal vitamins daily while attempting to conceive in order to reduce risk for spina bifida 3. Watch for signs or symptoms of pregnancy including nausea with vomiting, breast tenderness, etc. 4. Irregular menstrual cycles are due to lactation status and not using contraception

## 2016-12-02 NOTE — Progress Notes (Signed)
Chief complaint: 1. Amenorrhea  N/A is a 21 year old female para 2002, last menstrual period 08/30/2016, previously using the Ortho Evra patch, discontinued the patch on 08/26/2016 without restarting it, with no further menses. She has taken multiple home pregnancy tests which are negative. She is concerned about possible pregnancy. Patient is currently breast-feeding. She is not taking prenatal vitamins or vitamins at this time.  Past medical history, past surgical history, problem list, medications, and allergies are reviewed  OBJECTIVE: BP 103/67   Pulse 94   Ht 5\' 4"  (1.626 m)   Wt 149 lb 1.6 oz (67.6 kg)   LMP 08/26/2016 (Exact Date)   Breastfeeding? Yes   BMI 25.59 kg/m  Pleasant female in no acute distress. Alert and oriented. Physical exam deferred  ASSESSMENT: 1. Lactational amenorrhea  PLAN: 1. Start prenatal vitamins or multivitamins daily 2. Continue to breast-feed 3. Watch for signs or symptoms of pregnancy while having unprotected sex with current desire for conception. 4. Follow-up as needed.  A total of 15 minutes were spent face-to-face with the patient during this encounter and over half of that time dealt with counseling and coordination of care.   Brayton Mars, MD  Note: This dictation was prepared with Dragon dictation along with smaller phrase technology. Any transcriptional errors that result from this process are unintentional.

## 2016-12-09 ENCOUNTER — Encounter: Payer: Self-pay | Admitting: Obstetrics and Gynecology

## 2017-03-21 ENCOUNTER — Encounter: Payer: Self-pay | Admitting: Obstetrics and Gynecology

## 2017-03-24 NOTE — L&D Delivery Note (Signed)
Delivery Summary for Yvonne Morrison  Labor Events:   Preterm labor:   Rupture date: 02/01/2018  Rupture time: 4:36 PM  Rupture type: Artificial  Fluid Color:   Induction:   Augmentation:   Complications:   Cervical ripening:          Delivery:   Episiotomy:   Lacerations:   Repair suture:   Repair # of packets:   Blood loss (ml): 500   Information for the patient's newborn:  Rozella, Servello [[id number]]    Delivery 02/01/2018 9:02 PM by  Vaginal, Spontaneous Sex:  female Gestational Age: [redacted]w[redacted]d Delivery Clinician:   Living?:         APGARS  One minute Five minutes Ten minutes  Skin color:        Heart rate:        Grimace:        Muscle tone:        Breathing:        Totals: 8  9      Presentation/position:      Resuscitation:   Cord information:    Disposition of cord blood:     Blood gases sent?  Complications:   Placenta: Delivered:       appearance Newborn Measurements: Weight: 6 lb 9.8 oz (3000 g)  Height: 18.75"  Head circumference:    Chest circumference:    Other providers:    Additional  information: Forceps:   Vacuum:   Breech:   Observed anomalies        Delivery Note At 9:02 PM a viable and healthy female was delivered via Vaginal, Spontaneous (Presentation: Vertex; LOA position).  APGAR: 8, 9; weight  3000 grams.   Placenta status: spontaneously removed, intact.  Cord: 3-vessel  with the following complications: none.  Cord pH: not obtained. Delayed cord clamping observed.   Anesthesia:  IV Stadol x 1 dose Episiotomy:  None Lacerations: Perineal;2nd degree Suture Repair: 3.0 vicryl Est. Blood Loss (mL): 500  Mom to postpartum.  Baby to Couplet care / Skin to Skin.  [name] 02/01/2018, 9:41 PM

## 2017-03-25 ENCOUNTER — Other Ambulatory Visit: Payer: Self-pay

## 2017-03-25 MED ORDER — NORELGESTROMIN-ETH ESTRADIOL 150-35 MCG/24HR TD PTWK: 1.0000 | MEDICATED_PATCH | TRANSDERMAL | 3 refills | Status: DC

## 2017-06-17 ENCOUNTER — Encounter: Payer: Self-pay | Admitting: Obstetrics and Gynecology

## 2017-06-17 ENCOUNTER — Ambulatory Visit (INDEPENDENT_AMBULATORY_CARE_PROVIDER_SITE_OTHER): Payer: Medicaid Other | Admitting: Obstetrics and Gynecology

## 2017-06-17 VITALS — BP 98/60 | HR 84 | Ht 65.0 in | Wt 154.5 lb

## 2017-06-17 DIAGNOSIS — F419 Anxiety disorder, unspecified: Secondary | ICD-10-CM | POA: Diagnosis not present

## 2017-06-17 DIAGNOSIS — N912 Amenorrhea, unspecified: Secondary | ICD-10-CM

## 2017-06-17 DIAGNOSIS — Z3201 Encounter for pregnancy test, result positive: Secondary | ICD-10-CM | POA: Diagnosis not present

## 2017-06-17 MED ORDER — PROVIDA OB 20-20-1.25 MG PO CAPS: 1.0000 | ORAL_CAPSULE | Freq: Every day | ORAL | 11 refills | Status: DC

## 2017-06-17 NOTE — Progress Notes (Signed)
   GYNECOLOGY CLINIC PROGRESS NOTE Subjective:    Yvonne Morrison is a 22 y.o. female who presents for evaluation of menstrual irregularity. She believes she could be pregnant. Pregnancy is desired. Sexual Activity: single partner, contraception: Xulane patches weekly unti February when she discontinued. Current symptoms also include: fatigue and positive home pregnancy test. Last period was abnormal, heavier and lasted longer than usual cycles (lasted 9 days with large clots).   Patient's last menstrual period was 04/28/2017.   The following portions of the patient's history were reviewed and updated as appropriate: allergies, current medications, past family history, past medical history, past social history, past surgical history and problem list.  Review of Systems Pertinent items noted in HPI and remainder of comprehensive ROS otherwise negative.     Objective:    BP 98/60   Pulse 84   Ht 5\' 5"  (1.651 m)   Wt 154 lb 8 oz (70.1 kg)   LMP 04/28/2017   BMI 25.71 kg/m  General: alert, no distress and no acute distress    Lab Review Urine HCG: positive    Assessment:    Absence of menstruation with positive pregnancy test.    History of anxiety  Lactating in pregnancy  Plan:     1. Pregnancy Test: Positive: EDC: 02/02/2018, with EGA 7.1 weeks. Briefly discussed pre-natal care options. Pregnancy, Childbirth and the Newborn book given. Encouraged well-balanced diet, plenty of rest when needed, pre-natal vitamins daily and walking for exercise. Discussed self-help for nausea, avoiding OTC medications until consulting provider or pharmacist, other than Tylenol as needed, minimal caffeine (1-2 cups daily) and avoiding alcohol. She will schedule her OB intake visit with ultrasound in 2-3 weeks, and her new OB physical in the next 5 weeks.  Feel free to call with any questions.  Prenatal pills prescribed (currently taking OTC gummies).   2. H/o anxiety - does not go to counselor anymore since  9 months ago, and no longer takes her medication. She notes she has been doing well with minimal symptoms. Will continue to monitor symptoms.   3.  Is currently still breastfeeding 79 month old. Discussed timing of when to wean during current pregnancy.     [name], MD [facility]

## 2017-06-17 NOTE — Progress Notes (Signed)
Pt has a positive pregnancy test.

## 2017-07-05 DIAGNOSIS — O200 Threatened abortion: Secondary | ICD-10-CM | POA: Diagnosis not present

## 2017-07-05 DIAGNOSIS — Z3A09 9 weeks gestation of pregnancy: Secondary | ICD-10-CM | POA: Insufficient documentation

## 2017-07-05 DIAGNOSIS — O99341 Other mental disorders complicating pregnancy, first trimester: Secondary | ICD-10-CM | POA: Insufficient documentation

## 2017-07-05 DIAGNOSIS — O23593 Infection of other part of genital tract in pregnancy, third trimester: Secondary | ICD-10-CM | POA: Diagnosis present

## 2017-07-06 ENCOUNTER — Other Ambulatory Visit: Payer: Self-pay

## 2017-07-06 ENCOUNTER — Emergency Department
Admission: EM | Admit: 2017-07-06 | Discharge: 2017-07-06 | Disposition: A | Discharge status: Home / Self Care | Payer: Medicaid Other | Attending: Emergency Medicine | Admitting: Emergency Medicine

## 2017-07-06 ENCOUNTER — Emergency Department: Payer: Medicaid Other

## 2017-07-06 DIAGNOSIS — O200 Threatened abortion: Secondary | ICD-10-CM

## 2017-07-06 LAB — CBC WITH DIFFERENTIAL/PLATELET
Basophils Absolute: 0 10*3/uL (ref 0–0.1)
Basophils Relative: 1 %
EOS ABS: 0.1 10*3/uL (ref 0–0.7)
Eosinophils Relative: 1 %
HEMATOCRIT: 30.1 % — AB (ref 35.0–47.0)
HEMOGLOBIN: 10.5 g/dL — AB (ref 12.0–16.0)
Lymphocytes Relative: 35 %
Lymphs Abs: 2.3 10*3/uL (ref 1.0–3.6)
MCH: 29.6 pg (ref 26.0–34.0)
MCHC: 34.7 g/dL (ref 32.0–36.0)
MCV: 85.2 fL (ref 80.0–100.0)
Monocytes Absolute: 0.6 10*3/uL (ref 0.2–0.9)
Monocytes Relative: 9 %
NEUTROS ABS: 3.6 10*3/uL (ref 1.4–6.5)
NEUTROS PCT: 54 %
Platelets: 192 10*3/uL (ref 150–440)
RBC: 3.54 MIL/uL — AB (ref 3.80–5.20)
RDW: 13.2 % (ref 11.5–14.5)
WBC: 6.6 10*3/uL (ref 3.6–11.0)

## 2017-07-06 LAB — URINALYSIS, COMPLETE (UACMP) WITH MICROSCOPIC
BILIRUBIN URINE: NEGATIVE
Glucose, UA: NEGATIVE mg/dL
KETONES UR: 5 mg/dL — AB
LEUKOCYTES UA: NEGATIVE
Nitrite: NEGATIVE
Protein, ur: NEGATIVE mg/dL
SPECIFIC GRAVITY, URINE: 1.014 (ref 1.005–1.030)
pH: 7 (ref 5.0–8.0)

## 2017-07-06 LAB — WET PREP, GENITAL
Clue Cells Wet Prep HPF POC: NONE SEEN
Sperm: NONE SEEN
TRICH WET PREP: NONE SEEN
Yeast Wet Prep HPF POC: NONE SEEN

## 2017-07-06 LAB — HCG, QUANTITATIVE, PREGNANCY: HCG, BETA CHAIN, QUANT, S: 111814 m[IU]/mL — AB (ref <5)

## 2017-07-06 LAB — ABO/RH: ABO/RH(D): O POS

## 2017-07-06 LAB — CHLAMYDIA/NGC RT PCR (ARMC ONLY)
Chlamydia Tr: NOT DETECTED
N GONORRHOEAE: NOT DETECTED

## 2017-07-06 MED ORDER — ONDANSETRON 4 MG PO TBDP: 4.0000 mg | ORAL_TABLET | Freq: Three times a day (TID) | ORAL | 0 refills | Status: DC | PRN

## 2017-07-06 NOTE — ED Provider Notes (Signed)
[facility] Emergency Department Provider Note   ____________________________________________   First MD Initiated Contact with Patient 07/06/17 [phone]     (approximate)  I have reviewed the triage vital signs and the nursing notes.   HISTORY  Chief Complaint Vaginal Discharge ([redacted] weeks pregnant )    HPI Yvonne Morrison is a 22 y.o. female G3 P2 AB 0 approximately [redacted] weeks pregnant by dates who presents to the ED from home with a chief complaint of dark brown vaginal discharge.  Patient states 4 days ago she noted pink tinged tissue paper when she wiped after urination.  Tonight she wiped and saw dark brown vaginal discharge that looks like blood.  This concerned her so she came to the ED for evaluation.  Denies pelvic pain or cramping.  Last sexual intercourse over 1 week ago.  Patient would like to be checked for STDs.  Denies fever, chills, chest pain, shortness of breath, dysuria, diarrhea.  Does complain of morning sickness.  Denies recent travel or trauma.  Past Medical History:  Diagnosis Date  . Anxiety   . Maternal renal lithiasis, history of    2015, during 1st pregnancy  . Rectal bleeding     Patient Active Problem List   Diagnosis Date Noted  . Patient is a currently breast-feeding mother 12/02/2016  . Amenorrhea 12/02/2016  . History of cholestasis during pregnancy 12/02/2016  . Anxiety 09/08/2015  . Panic attack as reaction to stress 09/08/2015  . Maternal renal lithiasis, history of     Past Surgical History:  Procedure Laterality Date  . NO PAST SURGERIES      Prior to Admission medications   Medication Sig Start Date End Date Taking? Authorizing Provider  Prenat w/o A Vit-FeFum-FePo-FA (PROVIDA OB) 20-20-1.25 MG CAPS Take 1 tablet by mouth daily. 06/17/17   [name], MD  Prenatal Vit-Fe Fumarate-FA (MULTIVITAMIN-PRENATAL) 27-0.8 MG TABS tablet Take 1 tablet by mouth daily at 12 noon.    [provider]     Allergies Patient has no known allergies.  Family History  Problem Relation Age of Onset  . Diabetes Father   . Prostate cancer Father   . Cancer Neg Hx   . Heart disease Neg Hx     Social History Social History   Tobacco Use  . Smoking status: Never Smoker  . Smokeless tobacco: Never Used  Substance Use Topics  . Alcohol use: Yes    Alcohol/week: 0.0 oz    Comment: occass  . Drug use: No    Review of Systems  Constitutional: No fever/chills. Eyes: No visual changes. ENT: No sore throat. Cardiovascular: Denies chest pain. Respiratory: Denies shortness of breath. Gastrointestinal: No abdominal pain.  Positive for nausea and vomiting.  No diarrhea.  No constipation. Genitourinary: Positive for vaginal bleeding.  Negative for dysuria. Musculoskeletal: Negative for back pain. Skin: Negative for rash. Neurological: Negative for headaches, focal weakness or numbness.   ____________________________________________   PHYSICAL EXAM:  VITAL SIGNS: ED Triage Vitals  Enc Vitals Group     BP 07/06/17 0002 110/65     Pulse Rate 07/06/17 0002 80     Resp 07/06/17 0002 16     Temp 07/06/17 0002 98.8 F (37.1 C)     Temp Source 07/06/17 0002 Oral     SpO2 07/06/17 0002 100 %     Weight 07/06/17 0003 149 lb (67.6 kg)     Height 07/06/17 0003 5\' 5"  (1.651 m)     Head  Circumference --      Peak Flow --      Pain Score 07/06/17 0002 0     Pain Loc --      Pain Edu? --      Excl. in [place]? --     Constitutional: Alert and oriented. Well appearing and in no acute distress. Eyes: Conjunctivae are normal. PERRL. EOMI. Head: Atraumatic. Nose: No congestion/rhinnorhea. Mouth/Throat: Mucous membranes are moist.  Oropharynx non-erythematous. Neck: No stridor.   Cardiovascular: Normal rate, regular rhythm. Grossly normal heart sounds.  Good peripheral circulation. Respiratory: Normal respiratory effort.  No retractions. Lungs CTAB. Gastrointestinal: Soft and nontender to  light or deep palpation. No distention. No abdominal bruits. No CVA tenderness. Musculoskeletal: No lower extremity tenderness nor edema.  No joint effusions. Neurologic:  Normal speech and language. No gross focal neurologic deficits are appreciated. No gait instability. Skin:  Skin is warm, dry and intact. No rash noted. Psychiatric: Mood and affect are normal. Speech and behavior are normal.  ____________________________________________   LABS (all labs ordered are listed, but only abnormal results are displayed)  Labs Reviewed  WET PREP, GENITAL - Abnormal; Notable for the following components:      Result Value   WBC, Wet Prep HPF POC MODERATE (*)    All other components within normal limits  HCG, QUANTITATIVE, PREGNANCY - Abnormal; Notable for the following components:   hCG, Beta Chain, Quant, S 111,814 (*)    All other components within normal limits  URINALYSIS, COMPLETE (UACMP) WITH MICROSCOPIC - Abnormal; Notable for the following components:   Color, Urine YELLOW (*)    APPearance CLEAR (*)    Hgb urine dipstick SMALL (*)    Ketones, ur 5 (*)    Bacteria, UA FEW (*)    Squamous Epithelial / LPF 0-5 (*)    All other components within normal limits  CBC WITH DIFFERENTIAL/PLATELET - Abnormal; Notable for the following components:   RBC 3.54 (*)    Hemoglobin 10.5 (*)    HCT 30.1 (*)    All other components within normal limits  CHLAMYDIA/NGC RT PCR ([facility] ONLY)  TYPE AND SCREEN   ____________________________________________  EKG  None ____________________________________________  RADIOLOGY  ED MD interpretation: 7-week IUP  Official radiology report(s): US Ob Comp Less 14 Wks  Result Date: 07/06/2017 CLINICAL DATA:  Acute onset of vaginal discharge. EXAM: OBSTETRIC <14 WK [place] AND TRANSVAGINAL OB US TECHNIQUE: Both transabdominal and transvaginal ultrasound examinations were performed for complete evaluation of the gestation as well as the maternal  uterus, adnexal regions, and pelvic cul-de-sac. Transvaginal technique was performed to assess early pregnancy. COMPARISON:  Pelvic ultrasound performed 10/08/2013 FINDINGS: Intrauterine gestational sac: Single; visualized and normal in shape. Yolk sac:  Yes Embryo:  Yes Cardiac Activity: Yes Heart Rate: 139 bpm CRL: 9.7  mm   7 w   0 d                  [place] EDC: 02/22/2018 Subchorionic hemorrhage:  None visualized. Maternal uterus/adnexae: The uterus is otherwise unremarkable. The ovaries are within normal limits. The right ovary measures 3.8 x 1.7 x 1.7 cm, while the left ovary measures 2.7 x 1.7 x 1.6 cm. No suspicious adnexal masses are seen; there is no evidence for ovarian torsion. No free fluid is seen within the pelvic cul-de-sac. IMPRESSION: Single live intrauterine pregnancy noted, with a crown-rump length of 1.0 cm, corresponding to a gestational age of [redacted] weeks 0 days. This does not match  the gestational age by LMP, and reflects a new estimated date of delivery of February 22, 2018. Electronically Signed   By: Garald Balding M.D.   On: 07/06/2017 04:29   US Ob Transvaginal  Result Date: 07/06/2017 CLINICAL DATA:  Acute onset of vaginal discharge. EXAM: OBSTETRIC <14 WK [place] AND TRANSVAGINAL OB US TECHNIQUE: Both transabdominal and transvaginal ultrasound examinations were performed for complete evaluation of the gestation as well as the maternal uterus, adnexal regions, and pelvic cul-de-sac. Transvaginal technique was performed to assess early pregnancy. COMPARISON:  Pelvic ultrasound performed 10/08/2013 FINDINGS: Intrauterine gestational sac: Single; visualized and normal in shape. Yolk sac:  Yes Embryo:  Yes Cardiac Activity: Yes Heart Rate: 139 bpm CRL: 9.7  mm   7 w   0 d                  [place] EDC: 02/22/2018 Subchorionic hemorrhage:  None visualized. Maternal uterus/adnexae: The uterus is otherwise unremarkable. The ovaries are within normal limits. The right ovary measures 3.8 x 1.7 x 1.7 cm, while  the left ovary measures 2.7 x 1.7 x 1.6 cm. No suspicious adnexal masses are seen; there is no evidence for ovarian torsion. No free fluid is seen within the pelvic cul-de-sac. IMPRESSION: Single live intrauterine pregnancy noted, with a crown-rump length of 1.0 cm, corresponding to a gestational age of [redacted] weeks 0 days. This does not match the gestational age by LMP, and reflects a new estimated date of delivery of February 22, 2018. Electronically Signed   By: Garald Balding M.D.   On: 07/06/2017 04:29    ____________________________________________   PROCEDURES  Procedure(s) performed:   Pelvic exam: External exam WNL rashes, lesions or vesicles.  Speculum exam reveals no active vaginal bleeding.  Cervical loss is closed.  Dark brown discharge coming from above the cervical loss.  Bimanual exam within normal limits.  Procedures  Critical Care performed: No  ____________________________________________   INITIAL IMPRESSION / ASSESSMENT AND PLAN / ED COURSE  As part of my medical decision making, I reviewed the following data within the [place] notes reviewed and incorporated, Labs reviewed, Old chart reviewed and Notes from prior ED visits   22 year old female approximately [redacted] weeks pregnant by dates who presents with vaginal spotting. Differential diagnosis includes, but is not limited to, ovarian cyst, ovarian torsion, acute appendicitis, diverticulitis, urinary tract infection/pyelonephritis, endometriosis, bowel obstruction, colitis, renal colic, gastroenteritis, hernia, fibroids, endometriosis, pregnancy related pain including ectopic pregnancy, etc.    Clinical Course as of Jul 06 721  Mon Jul 06, 2017  0452 Updated patient on ultrasound results.  She is O+.  Awaiting CBC, urinalysis and DNA.   [JS]  [id number] Updated patient on negative DNA, wet prep and CBC results.  Patient sees Dr. Marcelline Mates and will follow up this week.  Strict return precautions given.   Patient verbalizes understanding and agrees with plan of care.   [JS]    Clinical Course User Index [JS] Paulette Blanch, MD     ____________________________________________   FINAL CLINICAL IMPRESSION(S) / ED DIAGNOSES  Final diagnoses:  Threatened miscarriage in early pregnancy     ED Discharge Orders    None       Note:  This document was prepared using Dragon voice recognition software and may include unintentional dictation errors.    Paulette Blanch, MD 07/06/17 [phone]

## 2017-07-06 NOTE — ED Notes (Signed)
Pt transported to US at this time. 

## 2017-07-06 NOTE — ED Triage Notes (Signed)
Nine weeks pregnant. Patient presents to ED for dark brown vaginal discharge that started four days ago. States at onset it was pink in color. Denies abdominal pain or cramping. No dysuria, urgency or frequency.

## 2017-07-06 NOTE — ED Notes (Signed)
Pt blood typed in the past per lab and reported to be O+.

## 2017-07-06 NOTE — ED Notes (Signed)
In to obtain urine.  Patient states recently used restroom.  Instructed patient we would need a specimen, cup placed beside toilet. Patient verbalized understanding.

## 2017-07-06 NOTE — Discharge Instructions (Addendum)
1.  Avoid using tampons, douche or sexual intercourse until seen by your doctor. 2.  You may take Zofran as needed for morning sickness. 3.  Drink plenty of fluids daily. 4.  Return to the ER for worsening symptoms, persistent vomiting, bleeding through more than 1 pad per hour, or other concerns.

## 2017-07-09 ENCOUNTER — Other Ambulatory Visit: Payer: Medicaid Other

## 2017-07-14 ENCOUNTER — Encounter: Payer: Self-pay | Admitting: Obstetrics and Gynecology

## 2017-07-14 ENCOUNTER — Ambulatory Visit (INDEPENDENT_AMBULATORY_CARE_PROVIDER_SITE_OTHER): Payer: Medicaid Other | Admitting: Obstetrics and Gynecology

## 2017-07-14 VITALS — BP 104/74 | HR 75 | Ht 65.0 in | Wt 148.8 lb

## 2017-07-14 DIAGNOSIS — L299 Pruritus, unspecified: Secondary | ICD-10-CM | POA: Diagnosis not present

## 2017-07-14 DIAGNOSIS — Z8719 Personal history of other diseases of the digestive system: Secondary | ICD-10-CM

## 2017-07-14 DIAGNOSIS — Z8759 Personal history of other complications of pregnancy, childbirth and the puerperium: Secondary | ICD-10-CM | POA: Diagnosis not present

## 2017-07-14 DIAGNOSIS — O200 Threatened abortion: Secondary | ICD-10-CM | POA: Diagnosis not present

## 2017-07-14 LAB — POCT URINALYSIS DIPSTICK
Bilirubin, UA: NEGATIVE
Blood, UA: NEGATIVE
GLUCOSE UA: NEGATIVE
Ketones, UA: NEGATIVE
Leukocytes, UA: NEGATIVE
Nitrite, UA: NEGATIVE
Spec Grav, UA: 1.015 (ref 1.010–1.025)
Urobilinogen, UA: 0.2 E.U./dL
pH, UA: 7 (ref 5.0–8.0)

## 2017-07-14 NOTE — Progress Notes (Signed)
GYNECOLOGY PROGRESS NOTE  Subjective:    Patient ID: Yvonne Morrison, female    DOB: 08-Feb-1996, 22 y.o.   MRN: [id number]  HPI  Patient is a 22 y.o. [id number] female who presents for f/u from Emergency Room visit last week (07/06/2017) for complaints of vaginal bleeding in early pregnancy. Patient notes that since her ER visit, she had 1 episode of bright red bleeding on Saturday, small amount, and since has had brown spotting.   Of note, patient states that she has begun to notice itching. Has h/o cholestasis in last 2 pregnancies.    The following portions of the patient's history were reviewed and updated as appropriate: allergies, current medications, past family history, past medical history, past social history, past surgical history and problem list.  Review of Systems Pertinent items noted in HPI and remainder of comprehensive ROS otherwise negative.   Objective:   Blood pressure 104/74, pulse 75, height 5\' 5"  (1.651 m), weight 148 lb 12.8 oz (67.5 kg), last menstrual period 04/28/2017, currently breastfeeding. General appearance: alert and no distress Abdomen: soft, non-tender; bowel sounds normal; no masses,  no organomegaly Pelvic: external genitalia normal, rectovaginal septum normal.  Vagina with scant brown discharge.  Cervix closed, normal appearing, no lesions and no motion tenderness. Bimanual exam not performed.   Skin: no lesions, rashes.  Neurologic: Grossly normal    Labs:  Admission on 07/06/2017, Discharged on 07/06/2017  Component Date Value Ref Range Status  . hCG, Beta ChainAmerica Brown, S 07/06/2017 376,283* <5 mIU/mL Final   Comment:          GEST. AGE      CONC.  (mIU/mL)   <=1 WEEK        5 - 50     2 WEEKS       50 - 500     3 WEEKS       100 - 10,000     4 WEEKS     1,000 - 30,000     5 WEEKS     3,500 - 115,000   6-8 WEEKS     12,000 - 270,000    12 WEEKS     15,000 - 220,000        FEMALE AND NON-PREGNANT FEMALE:     LESS THAN 5 mIU/mL Performed at  [facility], [address] [place], [place] [address]   . Color, Urine 07/06/2017 YELLOW* YELLOW Final  . APPearance 07/06/2017 CLEAR* CLEAR Final  . Specific Gravity, Urine 07/06/2017 1.014  1.005 - 1.030 Final  . pH 07/06/2017 7.0  5.0 - 8.0 Final  . Glucose, UA 07/06/2017 NEGATIVE  NEGATIVE mg/dL Final  . Hgb urine dipstick 07/06/2017 SMALL* NEGATIVE Final  . Bilirubin Urine 07/06/2017 NEGATIVE  NEGATIVE Final  . Ketones, ur 07/06/2017 5* NEGATIVE mg/dL Final  . Protein, ur 07/06/2017 NEGATIVE  NEGATIVE mg/dL Final  . Nitrite 07/06/2017 NEGATIVE  NEGATIVE Final  . Leukocytes, UA 07/06/2017 NEGATIVE  NEGATIVE Final  . RBC / HPF 07/06/2017 0-5  0 - 5 RBC/hpf Final  . WBC, UA 07/06/2017 0-5  0 - 5 WBC/hpf Final  . Bacteria, UA 07/06/2017 FEW* NONE SEEN Final  . Squamous Epithelial / LPF 07/06/2017 0-5* NONE SEEN Final  . Mucus 07/06/2017 PRESENT   Final   Performed at [facility], [address], [place],  [address]  . WBC 07/06/2017 6.6  3.6 - 11.0 K/uL Final  . RBC 07/06/2017 3.54* 3.80 - 5.20 MIL/uL Final  .  Hemoglobin 07/06/2017 10.5* 12.0 - 16.0 g/dL Final  . HCT 07/06/2017 30.1* 35.0 - 47.0 % Final  . MCV 07/06/2017 85.2  80.0 - 100.0 fL Final  . MCH 07/06/2017 29.6  26.0 - 34.0 pg Final  . MCHC 07/06/2017 34.7  32.0 - 36.0 g/dL Final  . RDW 07/06/2017 13.2  11.5 - 14.5 % Final  . Platelets 07/06/2017 192  150 - 440 K/uL Final  . Neutrophils Relative % 07/06/2017 54  % Final  . Neutro Abs 07/06/2017 3.6  1.4 - 6.5 K/uL Final  . Lymphocytes Relative 07/06/2017 35  % Final  . Lymphs Abs 07/06/2017 2.3  1.0 - 3.6 K/uL Final  . Monocytes Relative 07/06/2017 9  % Final  . Monocytes Absolute 07/06/2017 0.6  0.2 - 0.9 K/uL Final  . Eosinophils Relative 07/06/2017 1  % Final  . Eosinophils Absolute 07/06/2017 0.1  0 - 0.7 K/uL Final  . Basophils Relative 07/06/2017 1  % Final  . Basophils Absolute 07/06/2017 0.0  0 - 0.1 K/uL Final   Performed at  [facility], [address], [place], [place] [address]  . Specimen source GC/Chlam 07/06/2017 ENDOCERVICAL   Final  . Chlamydia Tr 07/06/2017 NOT DETECTED  NOT DETECTED Final  . N gonorrhoeae 07/06/2017 NOT DETECTED  NOT DETECTED Final   Comment: (NOTE) 100  This methodology has not been evaluated in pregnant women or in 200  patients with a history of hysterectomy. 300 400  This methodology will not be performed on patients less than 76  years of age. Performed at [facility], [address], [place], [place] [address]   . Yeast Wet Prep HPF POC 07/06/2017 NONE SEEN  NONE SEEN Final  . Trich, Wet Prep 07/06/2017 NONE SEEN  NONE SEEN Final  . Clue Cells Wet Prep HPF POC 07/06/2017 NONE SEEN  NONE SEEN Final  . WBC, Wet Prep HPF POC 07/06/2017 MODERATE* NONE SEEN Final  . Sperm 07/06/2017 NONE SEEN   Final   Performed at [facility], [address] [place], [place] [address]  . ABO/RH(D) 07/06/2017    Final                   Value:O POS Performed at [facility], [address] [place], [place] [address]      Imaging:  US OB Transvaginal CLINICAL DATA:  Acute onset of vaginal discharge.  EXAM: OBSTETRIC <14 WK [place] AND TRANSVAGINAL OB US  TECHNIQUE: Both transabdominal and transvaginal ultrasound examinations were performed for complete evaluation of the gestation as well as the maternal uterus, adnexal regions, and pelvic cul-de-sac. Transvaginal technique was performed to assess early pregnancy.  COMPARISON:  Pelvic ultrasound performed 10/08/2013  FINDINGS: Intrauterine gestational sac: Single; visualized and normal in shape.  Yolk sac:  Yes  Embryo:  Yes  Cardiac Activity: Yes  Heart Rate: 139 bpm  CRL: 9.7  mm   7 w   0 d                  [place] EDC: 02/22/2018  Subchorionic hemorrhage:  None visualized.  Maternal uterus/adnexae: The uterus is otherwise unremarkable.  The ovaries are within normal limits. The right  ovary measures 3.8 x 1.7 x 1.7 cm, while the left ovary measures 2.7 x 1.7 x 1.6 cm. No suspicious adnexal masses are seen; there is no evidence for ovarian torsion.  No free fluid is seen within the pelvic cul-de-sac.  IMPRESSION: Single live  intrauterine pregnancy noted, with a crown-rump length of 1.0 cm, corresponding to a gestational age of [redacted] weeks 0 days. This does not match the gestational age by LMP, and reflects a new estimated date of delivery of February 22, 2018.  Electronically Signed   By: Garald Balding M.D.   On: 07/06/2017 04:29 US OB Comp Less 14 Wks CLINICAL DATA:  Acute onset of vaginal discharge.  EXAM: OBSTETRIC <14 WK [place] AND TRANSVAGINAL OB US  TECHNIQUE: Both transabdominal and transvaginal ultrasound examinations were performed for complete evaluation of the gestation as well as the maternal uterus, adnexal regions, and pelvic cul-de-sac. Transvaginal technique was performed to assess early pregnancy.  COMPARISON:  Pelvic ultrasound performed 10/08/2013  FINDINGS: Intrauterine gestational sac: Single; visualized and normal in shape.  Yolk sac:  Yes  Embryo:  Yes  Cardiac Activity: Yes  Heart Rate: 139 bpm  CRL: 9.7  mm   7 w   0 d                  [place] EDC: 02/22/2018  Subchorionic hemorrhage:  None visualized.  Maternal uterus/adnexae: The uterus is otherwise unremarkable.  The ovaries are within normal limits. The right ovary measures 3.8 x 1.7 x 1.7 cm, while the left ovary measures 2.7 x 1.7 x 1.6 cm. No suspicious adnexal masses are seen; there is no evidence for ovarian torsion.  No free fluid is seen within the pelvic cul-de-sac.  IMPRESSION: Single live intrauterine pregnancy noted, with a crown-rump length of 1.0 cm, corresponding to a gestational age of [redacted] weeks 0 days. This does not match the gestational age by LMP, and reflects a new estimated date of delivery of February 22, 2018.  Electronically Signed   By: Garald Balding M.D.   On: 07/06/2017 04:29   Assessment:   Threatened miscarriage Generalized itching H/o cholestasis in pregnancy  Plan:   1. Threatened miscarriage - Labs and imaging reviewed from ER visit.  At last visit in ER, noted to have a viable pregnancy despite bleeding.  Patient with continued intermittent light bleeding/spotting.  Will recheck BHCG levels and follow trend. Given bleeding precautions.  2. Generalized itching, h/o cholestasis in pregnancy. Although not common in first trimester will order labs for assessment. Prescribed Vistaril for itching.    [name], MD [facility]

## 2017-07-14 NOTE — Progress Notes (Signed)
ROB pt is having bright and brown blood noticed since 07/06/17. Went to ED had [place] baby was [redacted]w[redacted]d.

## 2017-07-15 LAB — HEPATIC FUNCTION PANEL
ALT: 9 IU/L (ref 0–32)
AST: 12 IU/L (ref 0–40)
Albumin: 4.4 g/dL (ref 3.5–5.5)
Alkaline Phosphatase: 68 IU/L (ref 39–117)
BILIRUBIN TOTAL: 0.4 mg/dL (ref 0.0–1.2)
BILIRUBIN, DIRECT: 0.12 mg/dL (ref 0.00–0.40)
Total Protein: 7.6 g/dL (ref 6.0–8.5)

## 2017-07-15 LAB — BILE ACIDS, TOTAL: Bile Acids Total: 6.8 umol/L (ref 4.7–24.5)

## 2017-07-16 ENCOUNTER — Other Ambulatory Visit: Payer: Self-pay

## 2017-07-16 ENCOUNTER — Other Ambulatory Visit: Payer: Medicaid Other

## 2017-07-16 ENCOUNTER — Other Ambulatory Visit: Payer: Self-pay | Admitting: Obstetrics and Gynecology

## 2017-07-16 DIAGNOSIS — O200 Threatened abortion: Secondary | ICD-10-CM

## 2017-07-16 DIAGNOSIS — L299 Pruritus, unspecified: Secondary | ICD-10-CM

## 2017-07-17 LAB — BETA HCG QUANT (REF LAB): hCG Quant: 136073 m[IU]/mL

## 2017-07-20 ENCOUNTER — Encounter: Payer: Self-pay | Admitting: Obstetrics and Gynecology

## 2017-07-21 ENCOUNTER — Other Ambulatory Visit: Payer: Medicaid Other

## 2017-07-21 ENCOUNTER — Other Ambulatory Visit: Payer: Self-pay

## 2017-07-21 DIAGNOSIS — O200 Threatened abortion: Secondary | ICD-10-CM

## 2017-07-21 DIAGNOSIS — L299 Pruritus, unspecified: Secondary | ICD-10-CM

## 2017-07-22 ENCOUNTER — Encounter: Payer: Self-pay | Admitting: Obstetrics and Gynecology

## 2017-07-22 LAB — BETA HCG QUANT (REF LAB): hCG Quant: 129104 m[IU]/mL

## 2017-07-24 ENCOUNTER — Ambulatory Visit (INDEPENDENT_AMBULATORY_CARE_PROVIDER_SITE_OTHER): Payer: Medicaid Other

## 2017-07-24 DIAGNOSIS — N912 Amenorrhea, unspecified: Secondary | ICD-10-CM

## 2017-07-24 DIAGNOSIS — Z3201 Encounter for pregnancy test, result positive: Secondary | ICD-10-CM | POA: Diagnosis not present

## 2017-07-28 ENCOUNTER — Ambulatory Visit (INDEPENDENT_AMBULATORY_CARE_PROVIDER_SITE_OTHER): Payer: Medicaid Other | Admitting: Obstetrics and Gynecology

## 2017-07-28 VITALS — BP 104/71 | HR 95 | Ht 66.0 in | Wt 143.5 lb

## 2017-07-28 DIAGNOSIS — Z3491 Encounter for supervision of normal pregnancy, unspecified, first trimester: Secondary | ICD-10-CM

## 2017-07-28 DIAGNOSIS — Z8719 Personal history of other diseases of the digestive system: Secondary | ICD-10-CM

## 2017-07-28 DIAGNOSIS — Z8759 Personal history of other complications of pregnancy, childbirth and the puerperium: Secondary | ICD-10-CM

## 2017-07-28 NOTE — Patient Instructions (Signed)
First Trimester of Pregnancy The first trimester of pregnancy is from week 1 until the end of week 13 (months 1 through 3). During this time, your baby will begin to develop inside you. At 6-8 weeks, the eyes and face are formed, and the heartbeat can be seen on ultrasound. At the end of 12 weeks, all the baby's organs are formed. Prenatal care is all the medical care you receive before the birth of your baby. Make sure you get good prenatal care and follow all of your doctor's instructions. Follow these instructions at home: Medicines  Take over-the-counter and prescription medicines only as told by your doctor. Some medicines are safe and some medicines are not safe during pregnancy.  Take a prenatal vitamin that contains at least 600 micrograms (mcg) of folic acid.  If you have trouble pooping (constipation), take medicine that will make your stool soft (stool softener) if your doctor approves. Eating and drinking  Eat regular, healthy meals.  Your doctor will tell you the amount of weight gain that is right for you.  Avoid raw meat and uncooked cheese.  If you feel sick to your stomach (nauseous) or throw up (vomit): ? Eat 4 or 5 small meals a day instead of 3 large meals. ? Try eating a few soda crackers. ? Drink liquids between meals instead of during meals.  To prevent constipation: ? Eat foods that are high in fiber, like fresh fruits and vegetables, whole grains, and beans. ? Drink enough fluids to keep your pee (urine) clear or pale yellow. Activity  Exercise only as told by your doctor. Stop exercising if you have cramps or pain in your lower belly (abdomen) or low back.  Do not exercise if it is too hot, too humid, or if you are in a place of great height (high altitude).  Try to avoid standing for long periods of time. Move your legs often if you must stand in one place for a long time.  Avoid heavy lifting.  Wear low-heeled shoes. Sit and stand up straight.  You  can have sex unless your doctor tells you not to. Relieving pain and discomfort  Wear a good support bra if your breasts are sore.  Take warm water baths (sitz baths) to soothe pain or discomfort caused by hemorrhoids. Use hemorrhoid cream if your doctor says it is okay.  Rest with your legs raised if you have leg cramps or low back pain.  If you have puffy, bulging veins (varicose veins) in your legs: ? Wear support hose or compression stockings as told by your doctor. ? Raise (elevate) your feet for 15 minutes, 3-4 times a day. ? Limit salt in your food. Prenatal care  Schedule your prenatal visits by the twelfth week of pregnancy.  Write down your questions. Take them to your prenatal visits.  Keep all your prenatal visits as told by your doctor. This is important. Safety  Wear your seat belt at all times when driving.  Make a list of emergency phone numbers. The list should include numbers for family, friends, the hospital, and police and fire departments. General instructions  Ask your doctor for a referral to a local prenatal class. Begin classes no later than at the start of month 6 of your pregnancy.  Ask for help if you need counseling or if you need help with nutrition. Your doctor can give you advice or tell you where to go for help.  Do not use hot tubs, steam rooms, or   saunas.  Do not douche or use tampons or scented sanitary pads.  Do not cross your legs for long periods of time.  Avoid all herbs and alcohol. Avoid drugs that are not approved by your doctor.  Do not use any tobacco products, including cigarettes, chewing tobacco, and electronic cigarettes. If you need help quitting, ask your doctor. You may get counseling or other support to help you quit.  Avoid cat litter boxes and soil used by cats. These carry germs that can cause birth defects in the baby and can cause a loss of your baby (miscarriage) or stillbirth.  Visit your dentist. At home, brush  your teeth with a soft toothbrush. Be gentle when you floss. Contact a doctor if:  You are dizzy.  You have mild cramps or pressure in your lower belly.  You have a nagging pain in your belly area.  You continue to feel sick to your stomach, you throw up, or you have watery poop (diarrhea).  You have a bad smelling fluid coming from your vagina.  You have pain when you pee (urinate).  You have increased puffiness (swelling) in your face, hands, legs, or ankles. Get help right away if:  You have a fever.  You are leaking fluid from your vagina.  You have spotting or bleeding from your vagina.  You have very bad belly cramping or pain.  You gain or lose weight rapidly.  You throw up blood. It may look like coffee grounds.  You are around people who have German measles, fifth disease, or chickenpox.  You have a very bad headache.  You have shortness of breath.  You have any kind of trauma, such as from a fall or a car accident. Summary  The first trimester of pregnancy is from week 1 until the end of week 13 (months 1 through 3).  To take care of yourself and your unborn baby, you will need to eat healthy meals, take medicines only if your doctor tells you to do so, and do activities that are safe for you and your baby.  Keep all follow-up visits as told by your doctor. This is important as your doctor will have to ensure that your baby is healthy and growing well. This information is not intended to replace advice given to you by your health care provider. Make sure you discuss any questions you have with your health care provider. Document Released: 08/27/2007 Document Revised: 03/18/2016 Document Reviewed: 03/18/2016 Elsevier Interactive Patient Education  2017 Elsevier Inc.  

## 2017-07-28 NOTE — Progress Notes (Signed)
Peggye Fothergill presents for [facility] nurse interview visit. Pregnancy confirmation done here at [facility].  G-3 .  P-2    . Pregnancy education material explained and given. No cats in the home. NOB labs ordered.HIV labs and Drug screen were explained optional and she did not decline. Drug screen ordered/declined. PNV encouraged. Genetic screening options discussed. Genetic testing: Ordered/Declined/Unsure.  Pt may discuss with provider. Pt. To follow up with provider in _3 _ weeks for NOB physical.  All questions answered.

## 2017-07-28 NOTE — Progress Notes (Signed)
I have reviewed the record and concur with patient management and plan.  Brodey Bonn, MD [facility]     

## 2017-07-29 LAB — MONITOR DRUG PROFILE 14(MW)
AMPHETAMINE SCREEN URINE: NEGATIVE ng/mL
BARBITURATE SCREEN URINE: NEGATIVE ng/mL
BENZODIAZEPINE SCREEN, URINE: NEGATIVE ng/mL
Buprenorphine, Urine: NEGATIVE ng/mL
CANNABINOIDS UR QL SCN: NEGATIVE ng/mL
COCAINE(METAB.)SCREEN, URINE: NEGATIVE ng/mL
Creatinine(Crt), U: 327.3 mg/dL — ABNORMAL HIGH (ref 20.0–300.0)
Fentanyl, Urine: NEGATIVE pg/mL
MEPERIDINE SCREEN, URINE: NEGATIVE ng/mL
METHADONE SCREEN, URINE: NEGATIVE ng/mL
OXYCODONE+OXYMORPHONE UR QL SCN: NEGATIVE ng/mL
Opiate Scrn, Ur: NEGATIVE ng/mL
Ph of Urine: 6.1 (ref 4.5–8.9)
Phencyclidine Qn, Ur: NEGATIVE ng/mL
Propoxyphene Scrn, Ur: NEGATIVE ng/mL
SPECIFIC GRAVITY: 1.022
Tramadol Screen, Urine: NEGATIVE ng/mL

## 2017-07-29 LAB — GC/CHLAMYDIA PROBE AMP
CHLAMYDIA, DNA PROBE: NEGATIVE
Neisseria gonorrhoeae by PCR: NEGATIVE

## 2017-07-30 LAB — RUBELLA SCREEN: RUBELLA: 15.4 {index} (ref >0.99)

## 2017-07-30 LAB — CBC WITH DIFFERENTIAL/PLATELET
BASOS ABS: 0 10*3/uL (ref 0.0–0.2)
Basos: 0 %
EOS (ABSOLUTE): 0 10*3/uL (ref 0.0–0.4)
Eos: 1 %
Hematocrit: 34.1 % (ref 34.0–46.6)
Hemoglobin: 11.3 g/dL (ref 11.1–15.9)
IMMATURE GRANS (ABS): 0 10*3/uL (ref 0.0–0.1)
Immature Granulocytes: 0 %
LYMPHS ABS: 1.4 10*3/uL (ref 0.7–3.1)
Lymphs: 27 %
MCH: 29.3 pg (ref 26.6–33.0)
MCHC: 33.1 g/dL (ref 31.5–35.7)
MCV: 88 fL (ref 79–97)
MONOCYTES: 9 %
MONOS ABS: 0.5 10*3/uL (ref 0.1–0.9)
NEUTROS ABS: 3.2 10*3/uL (ref 1.4–7.0)
Neutrophils: 63 %
PLATELETS: 192 10*3/uL (ref 150–379)
RBC: 3.86 x10E6/uL (ref 3.77–5.28)
RDW: 14.3 % (ref 12.3–15.4)
WBC: 5.1 10*3/uL (ref 3.4–10.8)

## 2017-07-30 LAB — URINALYSIS, ROUTINE W REFLEX MICROSCOPIC
Bilirubin, UA: NEGATIVE
GLUCOSE, UA: NEGATIVE
Leukocytes, UA: NEGATIVE
NITRITE UA: NEGATIVE
PH UA: 6 (ref 5.0–7.5)
RBC, UA: NEGATIVE
Specific Gravity, UA: 1.029 (ref 1.005–1.030)
Urobilinogen, Ur: 1 mg/dL (ref 0.2–1.0)

## 2017-07-30 LAB — VARICELLA ZOSTER ANTIBODY, IGG: VARICELLA: 1168 {index} (ref >165)

## 2017-07-30 LAB — HIV ANTIBODY (ROUTINE TESTING W REFLEX): HIV Screen 4th Generation wRfx: NONREACTIVE

## 2017-07-30 LAB — ABO AND RH: RH TYPE: POSITIVE

## 2017-07-30 LAB — HEPATITIS B SURFACE ANTIGEN: HEP B S AG: NEGATIVE

## 2017-07-30 LAB — RPR: RPR Ser Ql: NONREACTIVE

## 2017-07-30 LAB — URINE CULTURE: ORGANISM ID, BACTERIA: NO GROWTH

## 2017-07-30 LAB — ANTIBODY SCREEN: ANTIBODY SCREEN: NEGATIVE

## 2017-08-18 ENCOUNTER — Ambulatory Visit (INDEPENDENT_AMBULATORY_CARE_PROVIDER_SITE_OTHER): Payer: Medicaid Other | Admitting: Obstetrics and Gynecology

## 2017-08-18 ENCOUNTER — Encounter: Payer: Self-pay | Admitting: Obstetrics and Gynecology

## 2017-08-18 VITALS — BP 89/56 | HR 99 | Wt 144.2 lb

## 2017-08-18 DIAGNOSIS — F419 Anxiety disorder, unspecified: Secondary | ICD-10-CM

## 2017-08-18 DIAGNOSIS — O09899 Supervision of other high risk pregnancies, unspecified trimester: Secondary | ICD-10-CM

## 2017-08-18 DIAGNOSIS — O09219 Supervision of pregnancy with history of pre-term labor, unspecified trimester: Secondary | ICD-10-CM

## 2017-08-18 DIAGNOSIS — R42 Dizziness and giddiness: Secondary | ICD-10-CM

## 2017-08-18 DIAGNOSIS — Z3492 Encounter for supervision of normal pregnancy, unspecified, second trimester: Secondary | ICD-10-CM | POA: Diagnosis not present

## 2017-08-18 DIAGNOSIS — R109 Unspecified abdominal pain: Secondary | ICD-10-CM

## 2017-08-18 DIAGNOSIS — O2652 Maternal hypotension syndrome, second trimester: Secondary | ICD-10-CM

## 2017-08-18 DIAGNOSIS — Z8719 Personal history of other diseases of the digestive system: Secondary | ICD-10-CM

## 2017-08-18 DIAGNOSIS — Z8759 Personal history of other complications of pregnancy, childbirth and the puerperium: Secondary | ICD-10-CM

## 2017-08-18 LAB — POCT URINALYSIS DIPSTICK
Bilirubin, UA: NEGATIVE
Blood, UA: NEGATIVE
GLUCOSE UA: NEGATIVE
NITRITE UA: NEGATIVE
PROTEIN UA: POSITIVE — AB
Urobilinogen, UA: 0.2 E.U./dL
pH, UA: 6 (ref 5.0–8.0)

## 2017-08-18 NOTE — Progress Notes (Signed)
OBSTETRIC INITIAL PRENATAL VISIT  Subjective:    Yvonne Morrison is being seen today for her first obstetrical visit.  This is not a planned pregnancy. She is a [id number] female at [redacted]w[redacted]d gestation, Estimated Date of Delivery: 02/22/18 with Patient's last menstrual period was 04/28/2017, inconsistent with 7 week ER sono. Her obstetrical history is significant for cholestasis in pregnancy x 2, preterm delivery x 2 (at 36 weeks). Relationship with FOB: spouse, living together. Patient does intend to breast feed. Pregnancy history fully reviewed.    OB History  Gravida Para Term Preterm AB Living  3 2 0 2 0 2  SAB TAB Ectopic Multiple Live Births  0 0 0 0 2    # Outcome Date GA Lbr Len/2nd Weight Sex Delivery Anes PTL Lv  3 Current           2 Preterm 01/10/16 [redacted]w[redacted]d 02:18 / 00:44 5 lb 3.8 oz (2.376 kg) M Vag-Spont None  LIV     Name: Mons[place]ais,PENDINGBABY     Apgar1: 8  Apgar5: 9  1 Preterm 03/07/14 [redacted]w[redacted]d  4 lb 1.9 oz (1.869 kg) F Vag-Spont   LIV    Obstetric Comments  History of cholestasis of pregnancy at 28 weeks  History of renal lithiasis    Gynecologic History:  Last pap smear was 06/2015.  Results were normal.  Denies h/o abnormal pap smears in the past.  Denies history of STIs.    Past Medical History:  Diagnosis Date  . Anxiety   . Maternal renal lithiasis, history of    2015, during 1st pregnancy  . Rectal bleeding      Family History  Problem Relation Age of Onset  . Diabetes Father   . Prostate cancer Father   . Cancer Neg Hx   . Heart disease Neg Hx      Past Surgical History:  Procedure Laterality Date  . NO PAST SURGERIES       Social History   Socioeconomic History  . Marital status: Married    Spouse name: Not on file  . Number of children: Not on file  . Years of education: Not on file  . Highest education level: Not on file  Occupational History  . Not on file  Social Needs  . Financial resource strain: Not on file  . Food insecurity:   Worry: Not on file    Inability: Not on file  . Transportation needs:    Medical: Not on file    Non-medical: Not on file  Tobacco Use  . Smoking status: Never Smoker  . Smokeless tobacco: Never Used  Substance and Sexual Activity  . Alcohol use: Yes    Alcohol/week: 0.0 oz    Comment: occass  . Drug use: No  . Sexual activity: Yes    Partners: Male    Birth control/protection: None  Lifestyle  . Physical activity:    Days per week: Not on file    Minutes per session: Not on file  . Stress: Not on file  Relationships  . Social connections:    Talks on phone: Not on file    Gets together: Not on file    Attends religious service: Not on file    Active member of club or organization: Not on file    Attends meetings of clubs or organizations: Not on file    Relationship status: Not on file  . Intimate partner violence:    Fear of current or ex partner: Not on  file    Emotionally abused: Not on file    Physically abused: Not on file    Forced sexual activity: Not on file  Other Topics Concern  . Not on file  Social History Narrative  . Not on file     Current Outpatient Medications on File Prior to Visit  Medication Sig Dispense Refill  . Prenatal Vit-Fe Fumarate-FA (MULTIVITAMIN-PRENATAL) 27-0.8 MG TABS tablet Take 1 tablet by mouth daily at 12 noon.     No current facility-administered medications on file prior to visit.      No Known Allergies    Review of Systems General:Not Present- Fever, Weight Loss and Weight Gain. Skin:Not Present- Rash. HEENT:Not Present- Blurred Vision, Headache and Bleeding Gums. Respiratory:Not Present- Difficulty Breathing. Breast:Not Present- Breast Mass. Cardiovascular:Not Present- Chest Pain, Elevated Blood Pressure, Fainting / Blacking Out and Shortness of Breath. Gastrointestinal:Not Present- Abdominal Pain, Constipation, Nausea and Vomiting. Female Genitourinary:Not Present- Frequency, Painful Urination, Pelvic  Pain, Vaginal Bleeding, Vaginal Discharge, Contractions, regular, Fetal Movements Decreased, Urinary Complaints and Vaginal Fluid. Musculoskeletal:Not Present- Back Pain and Leg Cramps. Present - Right side pain and pelvic pain intermittent x 1 week. Starts as sharp then goes to dull achy pain. Does not radiate. Has tried regular Tylenol, advised on ES.  Neurological: Present - Dizziness, lightheadedness. Psychiatric:Not Present- Depression.     Objective:   Blood pressure (!) 89/56, pulse 99, weight 144 lb 3.2 oz (65.4 kg), last menstrual period 04/28/2017, currently breastfeeding.  Body mass index is 23.27 kg/m.  General Appearance:    Alert, cooperative, no distress, appears stated age  Head:    Normocephalic, without obvious abnormality, atraumatic  Eyes:    PERRL, conjunctiva/corneas clear, EOM's intact, both eyes  Ears:    Normal external ear canals, both ears  Nose:   Nares normal, septum midline, mucosa normal, no drainage or sinus tenderness  Throat:   Lips, mucosa, and tongue normal; teeth and gums normal  Neck:   Supple, symmetrical, trachea midline, no adenopathy; thyroid: no enlargement/tenderness/nodules; no carotid bruit or JVD  Back:     Symmetric, no curvature, ROM normal, no CVA tenderness  Lungs:     Clear to auscultation bilaterally, respirations unlabored  Chest Wall:    No tenderness or deformity   Heart:    Regular rate and rhythm, S1 and S2 normal, no murmur, rub or gallop  Breast Exam:    No tenderness, masses, or nipple abnormality  Abdomen:     Soft, non-tender, bowel sounds active all four quadrants, no masses, no organomegaly.  FH 13 cm.  FHT 162 bpm.  Genitalia:    Pelvic:external genitalia normal, vagina without lesions, discharge, or tenderness, rectovaginal septum  normal. Cervix normal in appearance, no cervical motion tenderness, no adnexal masses or tenderness.  Pregnancy positive findings: uterine enlargement: 13 wk size, nontender.   Rectal:    Normal  external sphincter.  No hemorrhoids appreciated. Internal exam not done.   Extremities:   Extremities normal, atraumatic, no cyanosis or edema  Pulses:   2+ and symmetric all extremities  Skin:   Skin color, texture, turgor normal, no rashes or lesions  Lymph nodes:   Cervical, supraclavicular, and axillary nodes normal  Neurologic:   CNII-XII intact, normal strength, sensation and reflexes throughout    Assessment:    Pregnancy at 13 and 1/7 weeks   H/o preterm delivery x 2 H/o cholestasis in pregnancy Maternal hypotension Side pain Anxiety   Plan:   1. Pregnancy at 13 weeks -  Initial labs reviewed. - Prenatal vitamins encouraged. - Problem list reviewed and updated. - New OB counseling:  The patient has been given an overview regarding routine prenatal care.  Recommendations regarding diet, weight gain, and exercise in pregnancy were given. - Prenatal testing, optional genetic testing, and ultrasound use in pregnancy were reviewed.  - First trimester, second trimester, and cell-free DNA testing discussed: requested cell-free DNA testing with [facility]. - Benefits of Breast Feeding were discussed. The patient is encouraged to consider nursing her baby post partum.  2. H/o preterm delivery x 2 - both deliveries occurred at [redacted] weeks gestation.  Offered 17-OHP, patient declines (notes that both deliveries occurred prior to scheduled induction dates for cholestasis, where she was going to require earlier delivery, understands possibility of cholestasis occurring again in this pregnancy).   3. H/o cholestasis in pregnancy - Will continue to monitor during the pregnancy. Patient note itching symptoms several weeks ago was initiated on Vistaril, had baseline labs drawn. Understand need for earlier delivery (between 36-37 weeks) if it develops again this pregnancy.   4. Maternal hypotension - Patient's BPs lower than normal, likely cause of light-headedness. Discussed caution when changing  positions, aggressive hydration, can consume occasional salty foods (i.e. peanuts, pretzels).   5. Side pain - UA today negative for infection, no evidence of stones on UA or exam.  Likely musculoskeletal as patient often holds small child on right side. Discussed limiting picking up child, ES Tylenol, heating pad/ice packs.   6. Anxiety  - Can be managed with Vistaril (also being used for itching). Previously on no meds.  Notes she went to counseling a few times but stopped as she felt she was doing better managing symptoms. Has not been on other meds except Vistaril during pregnancies.   Follow up in 4 weeks.  50% of 30 min visit spent on counseling and coordination of care.    The patient has Medicaid.  [facility] Medicaid Risk Screening Form completed today    [name], MD [facility]

## 2017-08-18 NOTE — Progress Notes (Signed)
ROB-pt present for her NOB PE pt stated that she is having some side pain and pressure along with some lower pelvic pain. Pt stated that she has being getting lightheaded, weak and shaky occurs everyday x 3 weeks. Pt also stated that she has been feeling really tired.

## 2017-08-18 NOTE — Patient Instructions (Signed)
Second Trimester of Pregnancy The second trimester is from week 13 through week 28, month 4 through 6. This is often the time in pregnancy that you feel your best. Often times, morning sickness has lessened or quit. You may have more energy, and you may get hungry more often. Your unborn baby (fetus) is growing rapidly. At the end of the sixth month, he or she is about 9 inches long and weighs about 1 pounds. You will likely feel the baby move (quickening) between 18 and 20 weeks of pregnancy. Follow these instructions at home:  Avoid all smoking, herbs, and alcohol. Avoid drugs not approved by your doctor.  Do not use any tobacco products, including cigarettes, chewing tobacco, and electronic cigarettes. If you need help quitting, ask your doctor. You may get counseling or other support to help you quit.  Only take medicine as told by your doctor. Some medicines are safe and some are not during pregnancy.  Exercise only as told by your doctor. Stop exercising if you start having cramps.  Eat regular, healthy meals.  Wear a good support bra if your breasts are tender.  Do not use hot tubs, steam rooms, or saunas.  Wear your seat belt when driving.  Avoid raw meat, uncooked cheese, and liter boxes and soil used by cats.  Take your prenatal vitamins.  Take 1500-2000 milligrams of calcium daily starting at the 20th week of pregnancy until you deliver your baby.  Try taking medicine that helps you poop (stool softener) as needed, and if your doctor approves. Eat more fiber by eating fresh fruit, vegetables, and whole grains. Drink enough fluids to keep your pee (urine) clear or pale yellow.  Take warm water baths (sitz baths) to soothe pain or discomfort caused by hemorrhoids. Use hemorrhoid cream if your doctor approves.  If you have puffy, bulging veins (varicose veins), wear support hose. Raise (elevate) your feet for 15 minutes, 3-4 times a day. Limit salt in your diet.  Avoid heavy  lifting, wear low heals, and sit up straight.  Rest with your legs raised if you have leg cramps or low back pain.  Visit your dentist if you have not gone during your pregnancy. Use a soft toothbrush to brush your teeth. Be gentle when you floss.  You can have sex (intercourse) unless your doctor tells you not to.  Go to your doctor visits. Get help if:  You feel dizzy.  You have mild cramps or pressure in your lower belly (abdomen).  You have a nagging pain in your belly area.  You continue to feel sick to your stomach (nauseous), throw up (vomit), or have watery poop (diarrhea).  You have bad smelling fluid coming from your vagina.  You have pain with peeing (urination). Get help right away if:  You have a fever.  You are leaking fluid from your vagina.  You have spotting or bleeding from your vagina.  You have severe belly cramping or pain.  You lose or gain weight rapidly.  You have trouble catching your breath and have chest pain.  You notice sudden or extreme puffiness (swelling) of your face, hands, ankles, feet, or legs.  You have not felt the baby move in over an hour.  You have severe headaches that do not go away with medicine.  You have vision changes. This information is not intended to replace advice given to you by your health care provider. Make sure you discuss any questions you have with your health care   provider. Document Released: 06/04/2009 Document Revised: 08/16/2015 Document Reviewed: 05/11/2012 Elsevier Interactive Patient Education  2017 [employer]  

## 2017-08-25 ENCOUNTER — Encounter: Payer: Self-pay | Admitting: Obstetrics and Gynecology

## 2017-09-01 ENCOUNTER — Encounter: Payer: Self-pay | Admitting: Obstetrics and Gynecology

## 2017-09-15 ENCOUNTER — Ambulatory Visit (INDEPENDENT_AMBULATORY_CARE_PROVIDER_SITE_OTHER): Payer: Medicaid Other | Admitting: Obstetrics and Gynecology

## 2017-09-15 ENCOUNTER — Encounter: Payer: Self-pay | Admitting: Obstetrics and Gynecology

## 2017-09-15 VITALS — BP 91/58 | HR 85 | Wt 149.2 lb

## 2017-09-15 DIAGNOSIS — Z8759 Personal history of other complications of pregnancy, childbirth and the puerperium: Secondary | ICD-10-CM

## 2017-09-15 DIAGNOSIS — Z3482 Encounter for supervision of other normal pregnancy, second trimester: Secondary | ICD-10-CM

## 2017-09-15 DIAGNOSIS — Z8719 Personal history of other diseases of the digestive system: Secondary | ICD-10-CM

## 2017-09-15 LAB — POCT URINALYSIS DIPSTICK
Bilirubin, UA: NEGATIVE
GLUCOSE UA: NEGATIVE
Ketones, UA: NEGATIVE
LEUKOCYTES UA: NEGATIVE
NITRITE UA: NEGATIVE
Protein, UA: NEGATIVE
RBC UA: NEGATIVE
Spec Grav, UA: 1.01 (ref 1.010–1.025)
Urobilinogen, UA: 0.2 E.U./dL
pH, UA: 7 (ref 5.0–8.0)

## 2017-09-15 NOTE — Progress Notes (Signed)
ROB-pt stated that she is having back pain from pregnancy but also may think that she may have something going on with her kidney due to hx of kidney stones.

## 2017-09-15 NOTE — Progress Notes (Signed)
ROB: Patient desires AFP testing.  She is also specifically requesting bile acid testing again.  Reports itching palms and soles.  Concerned because her 2 prior pregnancies were affected by cholestasis.  FAS ultrasound next visit.

## 2017-09-16 ENCOUNTER — Other Ambulatory Visit: Payer: Medicaid Other

## 2017-09-16 DIAGNOSIS — Z3402 Encounter for supervision of normal first pregnancy, second trimester: Secondary | ICD-10-CM

## 2017-09-17 LAB — BILE ACIDS, TOTAL: Bile Acids Total: 8.3 umol/L (ref 4.7–24.5)

## 2017-09-18 LAB — AFP, SERUM, OPEN SPINA BIFIDA
AFP MoM: 0.89
AFP Value: 33.2 ng/mL
GEST. AGE ON COLLECTION DATE: 17 wk
Maternal Age At EDD: 23.7 yr
OSBR RISK 1 IN: 10000
TEST RESULTS AFP: NEGATIVE
Weight: 149 [lb_av]

## 2017-09-23 ENCOUNTER — Other Ambulatory Visit: Payer: Self-pay | Admitting: Obstetrics and Gynecology

## 2017-09-23 DIAGNOSIS — Z3689 Encounter for other specified antenatal screening: Secondary | ICD-10-CM

## 2017-10-06 ENCOUNTER — Ambulatory Visit (INDEPENDENT_AMBULATORY_CARE_PROVIDER_SITE_OTHER): Payer: Medicaid Other | Admitting: Obstetrics and Gynecology

## 2017-10-06 ENCOUNTER — Ambulatory Visit (INDEPENDENT_AMBULATORY_CARE_PROVIDER_SITE_OTHER): Payer: Medicaid Other

## 2017-10-06 VITALS — BP 83/54 | HR 88 | Wt 151.3 lb

## 2017-10-06 DIAGNOSIS — Z363 Encounter for antenatal screening for malformations: Secondary | ICD-10-CM | POA: Diagnosis not present

## 2017-10-06 DIAGNOSIS — Z3689 Encounter for other specified antenatal screening: Secondary | ICD-10-CM

## 2017-10-06 DIAGNOSIS — O09292 Supervision of pregnancy with other poor reproductive or obstetric history, second trimester: Secondary | ICD-10-CM

## 2017-10-06 DIAGNOSIS — Z3482 Encounter for supervision of other normal pregnancy, second trimester: Secondary | ICD-10-CM

## 2017-10-06 DIAGNOSIS — O283 Abnormal ultrasonic finding on antenatal screening of mother: Secondary | ICD-10-CM

## 2017-10-06 DIAGNOSIS — Z8719 Personal history of other diseases of the digestive system: Secondary | ICD-10-CM

## 2017-10-06 DIAGNOSIS — Z3A20 20 weeks gestation of pregnancy: Secondary | ICD-10-CM

## 2017-10-06 DIAGNOSIS — D229 Melanocytic nevi, unspecified: Secondary | ICD-10-CM

## 2017-10-06 DIAGNOSIS — Z8759 Personal history of other complications of pregnancy, childbirth and the puerperium: Secondary | ICD-10-CM

## 2017-10-06 LAB — POCT URINALYSIS DIPSTICK
Bilirubin, UA: NEGATIVE
Blood, UA: NEGATIVE
Glucose, UA: NEGATIVE
KETONES UA: NEGATIVE
LEUKOCYTES UA: NEGATIVE
Nitrite, UA: NEGATIVE
PH UA: 6 (ref 5.0–8.0)
PROTEIN UA: NEGATIVE
Spec Grav, UA: 1.015 (ref 1.010–1.025)
UROBILINOGEN UA: 0.2 U/dL

## 2017-10-06 MED ORDER — LIDOCAINE HCL URETHRAL/MUCOSAL 2 % EX GEL: 1.0000 "application " | CUTANEOUS | 0 refills | Status: DC | PRN

## 2017-10-06 NOTE — Progress Notes (Signed)
ROB: Patient still noting some itching of palms and soles, sometimes very intense. Also notes a mole on her left breast (new) that occasionally causes sharp pain.  2 mm mole noted at 7 o'clock. Discussed use of numbing gel, soft bras. Anatomy scan unable to identify nasal bone (possibly due to positioning), also upper limits of normal nuchal fold measurement, however Panorama normal. RTC in 3 weeks for OB visit and f/u ultrasound, in 1 week for labs (repeat bile acid levels).

## 2017-10-06 NOTE — Progress Notes (Signed)
ROB-pt stated that she has been having contractions but they do not hurt. Pt stated that when she gets up to walk they go away. Pt stated no other complaints.

## 2017-10-13 ENCOUNTER — Ambulatory Visit: Payer: Medicaid Other

## 2017-10-13 DIAGNOSIS — Z8759 Personal history of other complications of pregnancy, childbirth and the puerperium: Secondary | ICD-10-CM

## 2017-10-13 DIAGNOSIS — Z8719 Personal history of other diseases of the digestive system: Secondary | ICD-10-CM

## 2017-10-13 DIAGNOSIS — O283 Abnormal ultrasonic finding on antenatal screening of mother: Secondary | ICD-10-CM

## 2017-10-13 DIAGNOSIS — Z3482 Encounter for supervision of other normal pregnancy, second trimester: Secondary | ICD-10-CM

## 2017-10-14 LAB — BILE ACIDS, TOTAL: BILE ACIDS TOTAL: 6 umol/L (ref 0.0–10.0)

## 2017-10-27 ENCOUNTER — Encounter: Payer: Self-pay | Admitting: Obstetrics and Gynecology

## 2017-10-27 ENCOUNTER — Ambulatory Visit (INDEPENDENT_AMBULATORY_CARE_PROVIDER_SITE_OTHER): Payer: Medicaid Other

## 2017-10-27 ENCOUNTER — Ambulatory Visit (INDEPENDENT_AMBULATORY_CARE_PROVIDER_SITE_OTHER): Payer: Medicaid Other | Admitting: Obstetrics and Gynecology

## 2017-10-27 VITALS — BP 94/61 | HR 80 | Wt 157.0 lb

## 2017-10-27 DIAGNOSIS — Z3A23 23 weeks gestation of pregnancy: Secondary | ICD-10-CM

## 2017-10-27 DIAGNOSIS — R238 Other skin changes: Secondary | ICD-10-CM

## 2017-10-27 DIAGNOSIS — Z3482 Encounter for supervision of other normal pregnancy, second trimester: Secondary | ICD-10-CM

## 2017-10-27 DIAGNOSIS — R233 Spontaneous ecchymoses: Secondary | ICD-10-CM

## 2017-10-27 DIAGNOSIS — O283 Abnormal ultrasonic finding on antenatal screening of mother: Secondary | ICD-10-CM

## 2017-10-27 LAB — POCT URINALYSIS DIPSTICK
BILIRUBIN UA: NEGATIVE
Blood, UA: NEGATIVE
Glucose, UA: NEGATIVE
KETONES UA: NEGATIVE
Leukocytes, UA: NEGATIVE
NITRITE UA: NEGATIVE
Protein, UA: NEGATIVE
SPEC GRAV UA: 1.015 (ref 1.010–1.025)
UROBILINOGEN UA: 0.2 U/dL
pH, UA: 6.5 (ref 5.0–8.0)

## 2017-10-27 NOTE — Progress Notes (Signed)
ROB: Ultrasound completed today.  Patient denies issues with her cholestasis.  She reports that she has easy bruising and would like her platelets checked.  CBC done today.  1 hour GCT next visit

## 2017-10-27 NOTE — Progress Notes (Signed)
Pt states bruising easily and not healing. Pt also complaining about nose bleeds/clots.

## 2017-10-28 LAB — CBC
Hematocrit: 31 % — ABNORMAL LOW (ref 34.0–46.6)
Hemoglobin: 10.2 g/dL — ABNORMAL LOW (ref 11.1–15.9)
MCH: 30.6 pg (ref 26.6–33.0)
MCHC: 32.9 g/dL (ref 31.5–35.7)
MCV: 93 fL (ref 79–97)
Platelets: 230 10*3/uL (ref 150–450)
RBC: 3.33 x10E6/uL — ABNORMAL LOW (ref 3.77–5.28)
RDW: 13.7 % (ref 12.3–15.4)
WBC: 9.8 10*3/uL (ref 3.4–10.8)

## 2017-11-24 ENCOUNTER — Other Ambulatory Visit: Payer: Medicaid Other

## 2017-11-24 ENCOUNTER — Encounter: Payer: Medicaid Other | Admitting: Obstetrics and Gynecology

## 2017-11-24 ENCOUNTER — Ambulatory Visit (INDEPENDENT_AMBULATORY_CARE_PROVIDER_SITE_OTHER): Payer: Medicaid Other | Admitting: Obstetrics and Gynecology

## 2017-11-24 ENCOUNTER — Encounter: Payer: Self-pay | Admitting: Obstetrics and Gynecology

## 2017-11-24 VITALS — BP 105/68 | HR 87 | Wt 163.9 lb

## 2017-11-24 DIAGNOSIS — O09293 Supervision of pregnancy with other poor reproductive or obstetric history, third trimester: Secondary | ICD-10-CM

## 2017-11-24 DIAGNOSIS — Z3A28 28 weeks gestation of pregnancy: Secondary | ICD-10-CM | POA: Diagnosis not present

## 2017-11-24 DIAGNOSIS — Z8759 Personal history of other complications of pregnancy, childbirth and the puerperium: Secondary | ICD-10-CM

## 2017-11-24 DIAGNOSIS — Z23 Encounter for immunization: Secondary | ICD-10-CM | POA: Diagnosis not present

## 2017-11-24 DIAGNOSIS — Z3483 Encounter for supervision of other normal pregnancy, third trimester: Secondary | ICD-10-CM

## 2017-11-24 DIAGNOSIS — Z8719 Personal history of other diseases of the digestive system: Secondary | ICD-10-CM

## 2017-11-24 LAB — POCT URINALYSIS DIPSTICK OB
Bilirubin, UA: NEGATIVE
Blood, UA: NEGATIVE
Glucose, UA: NEGATIVE
Ketones, UA: NEGATIVE
LEUKOCYTES UA: NEGATIVE
Nitrite, UA: NEGATIVE
PH UA: 7 (ref 5.0–8.0)
PROTEIN: NEGATIVE
Spec Grav, UA: 1.015 (ref 1.010–1.025)
UROBILINOGEN UA: 0.2 U/dL

## 2017-11-24 MED ORDER — TETANUS-DIPHTH-ACELL PERTUSSIS 5-2.5-18.5 LF-MCG/0.5 IM SUSP
0.5000 mL | Freq: Once | INTRAMUSCULAR | Status: AC
  Administered 2017-11-24: 0.5 mL via INTRAMUSCULAR

## 2017-11-24 NOTE — Progress Notes (Signed)
ROB: Doing well, no complaints. For 28 week labs today.  Desires to breastfeed, desires contraceptive patch for contraception. For Tdap and flu vaccine today, signed blood consent. Declines circumcision for female infant. Desires to check bile acids again.  Notes symptoms of itching have not changed much but just desires lab check. RTC in 2 weeks.

## 2017-11-24 NOTE — Progress Notes (Signed)
ROB- PT stated that she is doing well. No complaints.

## 2017-11-26 LAB — CBC
HEMATOCRIT: 30.4 % — AB (ref 34.0–46.6)
Hemoglobin: 10.2 g/dL — ABNORMAL LOW (ref 11.1–15.9)
MCH: 31.1 pg (ref 26.6–33.0)
MCHC: 33.6 g/dL (ref 31.5–35.7)
MCV: 93 fL (ref 79–97)
Platelets: 220 10*3/uL (ref 150–450)
RBC: 3.28 x10E6/uL — AB (ref 3.77–5.28)
RDW: 12.3 % (ref 12.3–15.4)
WBC: 8.7 10*3/uL (ref 3.4–10.8)

## 2017-11-26 LAB — RPR: RPR Ser Ql: NONREACTIVE

## 2017-11-26 LAB — GLUCOSE, 1 HOUR GESTATIONAL: Gestational Diabetes Screen: 86 mg/dL (ref 65–139)

## 2017-11-26 LAB — BILE ACIDS, TOTAL: Bile Acids Total: 3.5 umol/L (ref 0.0–10.0)

## 2017-12-14 NOTE — Progress Notes (Signed)
Pt presents today for 30 week 1 day ROB. Pt states her braxton hicks contractions are painful, poor blood circulation in legs and swelling in legs.

## 2017-12-15 ENCOUNTER — Encounter: Payer: Self-pay | Admitting: Obstetrics and Gynecology

## 2017-12-15 ENCOUNTER — Ambulatory Visit (INDEPENDENT_AMBULATORY_CARE_PROVIDER_SITE_OTHER): Payer: Medicaid Other | Admitting: Obstetrics and Gynecology

## 2017-12-15 VITALS — BP 116/72 | HR 94 | Wt 166.0 lb

## 2017-12-15 DIAGNOSIS — Z3483 Encounter for supervision of other normal pregnancy, third trimester: Secondary | ICD-10-CM

## 2017-12-15 DIAGNOSIS — Z8719 Personal history of other diseases of the digestive system: Secondary | ICD-10-CM

## 2017-12-15 DIAGNOSIS — O09293 Supervision of pregnancy with other poor reproductive or obstetric history, third trimester: Secondary | ICD-10-CM

## 2017-12-15 DIAGNOSIS — Z3A30 30 weeks gestation of pregnancy: Secondary | ICD-10-CM

## 2017-12-15 DIAGNOSIS — Z8759 Personal history of other complications of pregnancy, childbirth and the puerperium: Secondary | ICD-10-CM

## 2017-12-15 LAB — POCT URINALYSIS DIPSTICK OB
Bilirubin, UA: NEGATIVE
Blood, UA: NEGATIVE
GLUCOSE, UA: NEGATIVE
KETONES UA: NEGATIVE
Leukocytes, UA: NEGATIVE
Nitrite, UA: NEGATIVE
POC,PROTEIN,UA: NEGATIVE
SPEC GRAV UA: 1.01 (ref 1.010–1.025)
Urobilinogen, UA: 0.2 E.U./dL
pH, UA: 7 (ref 5.0–8.0)

## 2017-12-15 NOTE — Progress Notes (Signed)
ROB: Patient doing well.  Describes occasional Braxton Hicks contractions.  Again requesting bile acids-will perform at 32 weeks with next visit.  Complains of occasional lower extremity edema at the end of the day.  No edema noted on exam.

## 2017-12-29 ENCOUNTER — Ambulatory Visit (INDEPENDENT_AMBULATORY_CARE_PROVIDER_SITE_OTHER): Payer: Medicaid Other | Admitting: Obstetrics and Gynecology

## 2017-12-29 VITALS — BP 113/71 | HR 94 | Wt 170.3 lb

## 2017-12-29 DIAGNOSIS — Z8719 Personal history of other diseases of the digestive system: Secondary | ICD-10-CM

## 2017-12-29 DIAGNOSIS — O09293 Supervision of pregnancy with other poor reproductive or obstetric history, third trimester: Secondary | ICD-10-CM

## 2017-12-29 DIAGNOSIS — Z3483 Encounter for supervision of other normal pregnancy, third trimester: Secondary | ICD-10-CM

## 2017-12-29 DIAGNOSIS — R0602 Shortness of breath: Secondary | ICD-10-CM

## 2017-12-29 DIAGNOSIS — O9989 Other specified diseases and conditions complicating pregnancy, childbirth and the puerperium: Secondary | ICD-10-CM

## 2017-12-29 DIAGNOSIS — Z8759 Personal history of other complications of pregnancy, childbirth and the puerperium: Secondary | ICD-10-CM

## 2017-12-29 LAB — POCT URINALYSIS DIPSTICK OB
Bilirubin, UA: NEGATIVE
Blood, UA: NEGATIVE
Glucose, UA: NEGATIVE
Ketones, UA: NEGATIVE
Leukocytes, UA: NEGATIVE
NITRITE UA: NEGATIVE
SPEC GRAV UA: 1.01 (ref 1.010–1.025)
Urobilinogen, UA: 0.2 E.U./dL
pH, UA: 7.5 (ref 5.0–8.0)

## 2017-12-29 NOTE — Progress Notes (Signed)
ROB-Pt stated that she is having some contractions but they are not regular, but becoming painful. No other complaints.

## 2017-12-29 NOTE — Progress Notes (Signed)
ROB: Notes having mild occasional contractions. Notes fetus lies to one side and is uncomfortable. Discussed ways to initiate position changes. Patient requesting bile acids.  Notes that she can't take the hydroxyzine because it makes her very sleepy and she is a stay at home mom. Also notes episodic moments of SOB and upper abdominal discomfort, followed by arm weakness. Usually lasts about 5 minutes and resolves. May be aggravation of diaphragm and phrenic nerve. Does not feel anxiety or sense of doom with symptoms. Given reassurance as symptoms usually self resolve.but advised to inform if symptoms worsen.  RTC in 2 weeks.

## 2017-12-30 ENCOUNTER — Other Ambulatory Visit: Payer: Medicaid Other

## 2018-01-01 LAB — BILE ACIDS, TOTAL: Bile Acids Total: 2.2 umol/L (ref 0.0–10.0)

## 2018-01-12 ENCOUNTER — Ambulatory Visit (INDEPENDENT_AMBULATORY_CARE_PROVIDER_SITE_OTHER): Payer: Medicaid Other | Admitting: Obstetrics and Gynecology

## 2018-01-12 ENCOUNTER — Encounter: Payer: Self-pay | Admitting: Obstetrics and Gynecology

## 2018-01-12 VITALS — BP 118/77 | HR 94 | Wt 172.0 lb

## 2018-01-12 DIAGNOSIS — Z8719 Personal history of other diseases of the digestive system: Secondary | ICD-10-CM

## 2018-01-12 DIAGNOSIS — O09293 Supervision of pregnancy with other poor reproductive or obstetric history, third trimester: Secondary | ICD-10-CM

## 2018-01-12 DIAGNOSIS — Z3483 Encounter for supervision of other normal pregnancy, third trimester: Secondary | ICD-10-CM

## 2018-01-12 DIAGNOSIS — Z8759 Personal history of other complications of pregnancy, childbirth and the puerperium: Secondary | ICD-10-CM

## 2018-01-12 LAB — POCT URINALYSIS DIPSTICK OB
Bilirubin, UA: NEGATIVE
Blood, UA: NEGATIVE
GLUCOSE, UA: NEGATIVE
KETONES UA: NEGATIVE
Leukocytes, UA: NEGATIVE
Nitrite, UA: NEGATIVE
SPEC GRAV UA: 1.01 (ref 1.010–1.025)
Urobilinogen, UA: 0.2 E.U./dL
pH, UA: 7 (ref 5.0–8.0)

## 2018-01-12 NOTE — Progress Notes (Signed)
ROB: Patient complains of persistent itching of her palms, inside her ears, her head.  She is again requesting bile acid levels.  Cultures next visit.

## 2018-01-12 NOTE — Progress Notes (Signed)
Pt presents today for ROB. Pt is concerned about cholecstasis

## 2018-01-14 ENCOUNTER — Telehealth: Payer: Self-pay

## 2018-01-14 LAB — BILE ACIDS, TOTAL: BILE ACIDS TOTAL: 11.8 umol/L — AB (ref 0.0–10.0)

## 2018-01-14 NOTE — Telephone Encounter (Signed)
Requested pt make an appt for 01/15/18 per DJE. Pt complied and will be seen tomorrow.

## 2018-01-15 ENCOUNTER — Encounter: Payer: Self-pay | Admitting: Obstetrics and Gynecology

## 2018-01-15 ENCOUNTER — Ambulatory Visit (INDEPENDENT_AMBULATORY_CARE_PROVIDER_SITE_OTHER): Payer: Medicaid Other | Admitting: Obstetrics and Gynecology

## 2018-01-15 VITALS — BP 114/74 | HR 86 | Wt 175.0 lb

## 2018-01-15 DIAGNOSIS — Z3483 Encounter for supervision of other normal pregnancy, third trimester: Secondary | ICD-10-CM

## 2018-01-15 DIAGNOSIS — O26613 Liver and biliary tract disorders in pregnancy, third trimester: Secondary | ICD-10-CM

## 2018-01-15 DIAGNOSIS — K831 Obstruction of bile duct: Secondary | ICD-10-CM

## 2018-01-15 LAB — POCT URINALYSIS DIPSTICK OB
BILIRUBIN UA: NEGATIVE
Glucose, UA: NEGATIVE
Ketones, UA: NEGATIVE
LEUKOCYTES UA: NEGATIVE
Nitrite, UA: NEGATIVE
POC,PROTEIN,UA: NEGATIVE
Spec Grav, UA: 1.01 (ref 1.010–1.025)
UROBILINOGEN UA: 0.2 U/dL
pH, UA: 7.5 (ref 5.0–8.0)

## 2018-01-15 MED ORDER — URSODIOL 500 MG PO TABS: 500.0000 mg | ORAL_TABLET | Freq: Two times a day (BID) | ORAL | 3 refills | Status: DC

## 2018-01-15 NOTE — Progress Notes (Signed)
ROB: Patient with symptomatic cholestasis of pregnancy.  Will prescribe ursodeoxycholic acid 959 mg 2 times daily.  Begin weekly biophysical profiles and weekly NSTs Tuesday and Friday.  Plan delivery at 37 completed weeks.  Discussed with patient.  Induction scheduled for 11/11  6 AM. Discussed care with Dr. Tamala Julian MFM and she recommended delivery at 37 weeks.  She states no steroids necessary if planned.  Patient delivering after 37 weeks.

## 2018-01-15 NOTE — Addendum Note (Signed)
Addended by: Lin Landsman on: 01/15/2018 12:24 PM   Modules accepted: Orders

## 2018-01-15 NOTE — Progress Notes (Signed)
Pt here to discuss lab results per DJE.

## 2018-01-19 ENCOUNTER — Ambulatory Visit (INDEPENDENT_AMBULATORY_CARE_PROVIDER_SITE_OTHER): Payer: Medicaid Other

## 2018-01-19 ENCOUNTER — Other Ambulatory Visit: Payer: Self-pay | Admitting: Obstetrics and Gynecology

## 2018-01-19 DIAGNOSIS — O26613 Liver and biliary tract disorders in pregnancy, third trimester: Secondary | ICD-10-CM

## 2018-01-19 DIAGNOSIS — K831 Obstruction of bile duct: Secondary | ICD-10-CM

## 2018-01-20 ENCOUNTER — Other Ambulatory Visit: Payer: Self-pay | Admitting: Obstetrics and Gynecology

## 2018-01-20 ENCOUNTER — Encounter: Payer: Medicaid Other | Admitting: Obstetrics and Gynecology

## 2018-01-20 DIAGNOSIS — O26643 Intrahepatic cholestasis of pregnancy, third trimester: Secondary | ICD-10-CM

## 2018-01-20 DIAGNOSIS — K831 Obstruction of bile duct: Secondary | ICD-10-CM

## 2018-01-20 DIAGNOSIS — O26613 Liver and biliary tract disorders in pregnancy, third trimester: Principal | ICD-10-CM

## 2018-01-22 ENCOUNTER — Ambulatory Visit (INDEPENDENT_AMBULATORY_CARE_PROVIDER_SITE_OTHER): Payer: Medicaid Other | Admitting: Obstetrics and Gynecology

## 2018-01-22 ENCOUNTER — Other Ambulatory Visit: Payer: Medicaid Other

## 2018-01-22 VITALS — BP 117/70 | HR 80 | Wt 175.4 lb

## 2018-01-22 DIAGNOSIS — O09293 Supervision of pregnancy with other poor reproductive or obstetric history, third trimester: Secondary | ICD-10-CM | POA: Diagnosis not present

## 2018-01-22 DIAGNOSIS — K831 Obstruction of bile duct: Secondary | ICD-10-CM

## 2018-01-22 DIAGNOSIS — Z8719 Personal history of other diseases of the digestive system: Secondary | ICD-10-CM

## 2018-01-22 DIAGNOSIS — Z8759 Personal history of other complications of pregnancy, childbirth and the puerperium: Secondary | ICD-10-CM

## 2018-01-22 DIAGNOSIS — Z3483 Encounter for supervision of other normal pregnancy, third trimester: Secondary | ICD-10-CM

## 2018-01-22 DIAGNOSIS — Z3A36 36 weeks gestation of pregnancy: Secondary | ICD-10-CM

## 2018-01-22 DIAGNOSIS — O26613 Liver and biliary tract disorders in pregnancy, third trimester: Secondary | ICD-10-CM

## 2018-01-22 LAB — POCT URINALYSIS DIPSTICK OB
Bilirubin, UA: NEGATIVE
GLUCOSE, UA: NEGATIVE
KETONES UA: NEGATIVE
Leukocytes, UA: NEGATIVE
Nitrite, UA: NEGATIVE
POC,PROTEIN,UA: NEGATIVE
Spec Grav, UA: 1.01 (ref 1.010–1.025)
Urobilinogen, UA: 0.2 E.U./dL
pH, UA: 7.5 (ref 5.0–8.0)

## 2018-01-22 NOTE — Progress Notes (Signed)
ROB-Pt stated that she is having a lot of lower pelvic pain and pressure. Pt stated having contractions yesterday but nothing regular. No other complaints.

## 2018-01-22 NOTE — Progress Notes (Signed)
1/17

## 2018-01-22 NOTE — Progress Notes (Signed)
ROB: Patient complains of pelvic pressure/pain. Notes contractions yesterday, but went away. Notes the urosodeoxycholic acid is not helping much with her itching. 36 week labs done today. NST performed today was reviewed and was found to be reactive.  Continue recommended antenatal testing and prenatal care. Scheduled for IOL at 37 weeks.    NONSTRESS TEST INTERPRETATION  INDICATIONS: Cholestasis of pregnancy FHR baseline: 150 bpm RESULTS:Reactive COMMENTS: Uterine irritability.   PLAN: 1. Continue fetal kick counts twice a day. 2. Continue antepartum testing as scheduled-Biweekly   [name], MD [facility]

## 2018-01-24 LAB — STREP GP B NAA: Strep Gp B NAA: NEGATIVE

## 2018-01-26 ENCOUNTER — Encounter: Payer: Medicaid Other | Admitting: Obstetrics and Gynecology

## 2018-01-26 ENCOUNTER — Other Ambulatory Visit: Payer: Medicaid Other

## 2018-01-26 ENCOUNTER — Ambulatory Visit (INDEPENDENT_AMBULATORY_CARE_PROVIDER_SITE_OTHER): Payer: Medicaid Other

## 2018-01-26 DIAGNOSIS — K831 Obstruction of bile duct: Secondary | ICD-10-CM

## 2018-01-26 DIAGNOSIS — Z8759 Personal history of other complications of pregnancy, childbirth and the puerperium: Secondary | ICD-10-CM

## 2018-01-26 DIAGNOSIS — Z8719 Personal history of other diseases of the digestive system: Secondary | ICD-10-CM

## 2018-01-26 DIAGNOSIS — Z3A35 35 weeks gestation of pregnancy: Secondary | ICD-10-CM | POA: Diagnosis not present

## 2018-01-26 DIAGNOSIS — O26613 Liver and biliary tract disorders in pregnancy, third trimester: Secondary | ICD-10-CM | POA: Diagnosis not present

## 2018-01-27 LAB — GC/CHLAMYDIA PROBE AMP

## 2018-01-29 ENCOUNTER — Other Ambulatory Visit (HOSPITAL_COMMUNITY)
Admission: RE | Admit: 2018-01-29 | Discharge: 2018-01-29 | Disposition: A | Discharge status: Home / Self Care | Payer: Medicaid Other | Source: Ambulatory Visit | Attending: Obstetrics and Gynecology | Admitting: Obstetrics and Gynecology

## 2018-01-29 ENCOUNTER — Ambulatory Visit (INDEPENDENT_AMBULATORY_CARE_PROVIDER_SITE_OTHER): Payer: Medicaid Other | Admitting: Obstetrics and Gynecology

## 2018-01-29 ENCOUNTER — Encounter: Payer: Self-pay | Admitting: Obstetrics and Gynecology

## 2018-01-29 ENCOUNTER — Other Ambulatory Visit: Payer: Medicaid Other

## 2018-01-29 VITALS — BP 123/76 | HR 91 | Wt 174.0 lb

## 2018-01-29 DIAGNOSIS — K831 Obstruction of bile duct: Secondary | ICD-10-CM

## 2018-01-29 DIAGNOSIS — O26613 Liver and biliary tract disorders in pregnancy, third trimester: Secondary | ICD-10-CM

## 2018-01-29 DIAGNOSIS — Z3A35 35 weeks gestation of pregnancy: Secondary | ICD-10-CM | POA: Diagnosis not present

## 2018-01-29 DIAGNOSIS — Z3483 Encounter for supervision of other normal pregnancy, third trimester: Secondary | ICD-10-CM | POA: Diagnosis present

## 2018-01-29 LAB — POCT URINALYSIS DIPSTICK OB
Bilirubin, UA: NEGATIVE
GLUCOSE, UA: NEGATIVE
Ketones, UA: NEGATIVE
LEUKOCYTES UA: NEGATIVE
Nitrite, UA: NEGATIVE
POC,PROTEIN,UA: NEGATIVE
SPEC GRAV UA: 1.01 (ref 1.010–1.025)
Urobilinogen, UA: 0.2 E.U./dL
pH, UA: 7 (ref 5.0–8.0)

## 2018-01-29 NOTE — Progress Notes (Signed)
Pt presents today for NST. Pt states she feels mild irregular contractions but is doing well overall.

## 2018-01-29 NOTE — H&P (Signed)
History and Physical   HPI  Yvonne Morrison is a 22 y.o. [id number] at [redacted]w[redacted]d Estimated Date of Delivery: 02/22/18 who is being admitted for  induction of labor for cholestasis.   OB History  OB History  Gravida Para Term Preterm AB Living  3 2 0 2 0 2  SAB TAB Ectopic Multiple Live Births  0 0 0 0 2    # Outcome Date GA Lbr Len/2nd Weight Sex Delivery Anes PTL Lv  3 Current           2 Preterm 01/10/16 [redacted]w[redacted]d 02:18 / 00:44 5 lb 3.8 oz (2.376 kg) M Vag-Spont None  LIV     Name: Tudisco,PENDINGBABY     Apgar1: 8  Apgar5: 9  1 Preterm 03/07/14 [redacted]w[redacted]d  4 lb 1.9 oz (1.869 kg) F Vag-Spont   LIV    Obstetric Comments  History of cholestasis of pregnancy at 28 weeks  History of renal lithiasis    PROBLEM LIST  Pregnancy complications or risks: Patient Active Problem List   Diagnosis Date Noted  . History of preterm delivery, currently pregnant 08/18/2017  . Maternal hypotension syndrome in second trimester 08/18/2017  . Patient is a currently breast-feeding mother 12/02/2016  . Amenorrhea 12/02/2016  . History of cholestasis during pregnancy 12/02/2016  . Anxiety 09/08/2015  . Panic attack as reaction to stress 09/08/2015  . Maternal renal lithiasis, history of      Prenatal labs and studies: ABO, Rh: O/Positive/-- (05/07 1528) Antibody: Negative (05/07 1528) Rubella: 15.40 (05/07 1528) RPR: Non Reactive (09/03 1529)  HBsAg: Negative (05/07 1528)  HIV: Non Reactive (05/07 1528)  AVW:UJWJXBJY (11/01 1602)   Past Medical History:  Diagnosis Date  . Anxiety   . Maternal renal lithiasis, history of    2015, during 1st pregnancy  . Rectal bleeding      Past Surgical History:  Procedure Laterality Date  . NO PAST SURGERIES       Medications    Patient's Medications  New Prescriptions   No medications on file  Previous Medications   FERROUS SULFATE 325 (65 FE) MG EC TABLET    Take 325 mg by mouth 3 (three) times daily with meals.   LIDOCAINE (XYLOCAINE) 2 %  JELLY    Apply 1 application topically as needed.   PRENATAL VIT-FE FUMARATE-FA (MULTIVITAMIN-PRENATAL) 27-0.8 MG TABS TABLET    Take 1 tablet by mouth daily at 12 noon.   URSODIOL (ACTIGALL) 500 MG TABLET    Take 1 tablet (500 mg total) by mouth 2 (two) times daily.  Modified Medications   No medications on file  Discontinued Medications   No medications on file     Allergies  Patient has no known allergies.  Review of Systems  Pertinent items are noted in HPI.  Physical Exam  BP 123/76   Pulse 91   Wt 174 lb (78.9 kg)   LMP 04/28/2017   BMI 28.08 kg/m   Lungs:  CTA B Cardio: RRR without M/R/G Abd: Soft, gravid, NT Presentation: cephalic EXT: No C/C/ 1+ Edema DTRs: 2+ B CERVIX:     See Prenatal records for more detailed PE.     Test Results  Results for orders placed or performed in visit on 01/29/18 (from the past 24 hour(s))  POC Urinalysis Dipstick OB     Status: None   Collection Time: 01/29/18  9:11 AM  Result Value Ref Range   Color, UA yellow    Clarity, UA  clear    Glucose, UA Negative Negative   Bilirubin, UA negative    Ketones, UA negative    Spec Grav, UA 1.010 1.010 - 1.025   Blood, UA trace    pH, UA 7.0 5.0 - 8.0   POC,PROTEIN,UA Negative Negative, Trace   Urobilinogen, UA 0.2 0.2 or 1.0 E.U./dL   Nitrite, UA negative    Leukocytes, UA Negative Negative   Appearance yellow    Odor none    Group B Strep negative  Assessment   [id number] at [redacted]w[redacted]d Estimated Date of Delivery: 02/22/18  The fetus is reassuring.    Patient Active Problem List   Diagnosis Date Noted  . History of preterm delivery, currently pregnant 08/18/2017  . Maternal hypotension syndrome in second trimester 08/18/2017  . Patient is a currently breast-feeding mother 12/02/2016  . Amenorrhea 12/02/2016  . History of cholestasis during pregnancy 12/02/2016  . Anxiety 09/08/2015  . Panic attack as reaction to stress 09/08/2015  . Maternal renal lithiasis, history of      Plan  1. Admit to L&D :    2. EFM: -- Category 1 3. Epidural if desired.  Stadol for IV pain until epidural requested. 4. Admission labs    Finis Bud, M.D. 01/29/2018 11:12 AM

## 2018-01-29 NOTE — H&P (Deleted)
  The note originally documented on this encounter has been moved the the encounter in which it belongs.  

## 2018-01-29 NOTE — Progress Notes (Signed)
ROB:  Itching improved but still there.  Induction sched for Monday.  Misoprostol discussed.   NONSTRESS TEST INTERPRETATION  INDICATIONS: Cholestasis  RESULTS:  reactive COMMENTS: Induction Monday  PLAN: 1. Continue fetal kick counts as directed. 2. Continue antepartum testing as scheduled.  Finis Bud, M.D. 01/29/2018 10:29 AM

## 2018-02-01 ENCOUNTER — Other Ambulatory Visit: Payer: Self-pay

## 2018-02-01 ENCOUNTER — Inpatient Hospital Stay
Admission: EM | Admit: 2018-02-01 | Discharge: 2018-02-03 | DRG: 805 | Disposition: A | Discharge status: Home / Self Care | Payer: Medicaid Other | Attending: Obstetrics and Gynecology | Admitting: Obstetrics and Gynecology

## 2018-02-01 DIAGNOSIS — O9902 Anemia complicating childbirth: Secondary | ICD-10-CM | POA: Diagnosis present

## 2018-02-01 DIAGNOSIS — O26613 Liver and biliary tract disorders in pregnancy, third trimester: Secondary | ICD-10-CM

## 2018-02-01 DIAGNOSIS — Z3A37 37 weeks gestation of pregnancy: Secondary | ICD-10-CM

## 2018-02-01 DIAGNOSIS — R079 Chest pain, unspecified: Secondary | ICD-10-CM | POA: Diagnosis present

## 2018-02-01 DIAGNOSIS — O09899 Supervision of other high risk pregnancies, unspecified trimester: Secondary | ICD-10-CM

## 2018-02-01 DIAGNOSIS — O99344 Other mental disorders complicating childbirth: Secondary | ICD-10-CM | POA: Diagnosis present

## 2018-02-01 DIAGNOSIS — F419 Anxiety disorder, unspecified: Secondary | ICD-10-CM | POA: Diagnosis present

## 2018-02-01 DIAGNOSIS — O09219 Supervision of pregnancy with history of pre-term labor, unspecified trimester: Secondary | ICD-10-CM

## 2018-02-01 DIAGNOSIS — M79669 Pain in unspecified lower leg: Secondary | ICD-10-CM | POA: Diagnosis present

## 2018-02-01 DIAGNOSIS — D649 Anemia, unspecified: Secondary | ICD-10-CM | POA: Diagnosis present

## 2018-02-01 DIAGNOSIS — Z3A36 36 weeks gestation of pregnancy: Secondary | ICD-10-CM | POA: Diagnosis not present

## 2018-02-01 DIAGNOSIS — O9089 Other complications of the puerperium, not elsewhere classified: Secondary | ICD-10-CM | POA: Diagnosis present

## 2018-02-01 DIAGNOSIS — O99013 Anemia complicating pregnancy, third trimester: Secondary | ICD-10-CM

## 2018-02-01 DIAGNOSIS — K831 Obstruction of bile duct: Secondary | ICD-10-CM | POA: Diagnosis present

## 2018-02-01 DIAGNOSIS — O2662 Liver and biliary tract disorders in childbirth: Principal | ICD-10-CM | POA: Diagnosis present

## 2018-02-01 LAB — URINALYSIS, ROUTINE W REFLEX MICROSCOPIC
BILIRUBIN URINE: NEGATIVE
Glucose, UA: NEGATIVE mg/dL
HGB URINE DIPSTICK: NEGATIVE
Ketones, ur: NEGATIVE mg/dL
Nitrite: NEGATIVE
PROTEIN: NEGATIVE mg/dL
SPECIFIC GRAVITY, URINE: 1.013 (ref 1.005–1.030)
pH: 6 (ref 5.0–8.0)

## 2018-02-01 LAB — CBC WITH DIFFERENTIAL/PLATELET
Abs Immature Granulocytes: 0.04 10*3/uL (ref 0.00–0.07)
BASOS ABS: 0 10*3/uL (ref 0.0–0.1)
Basophils Relative: 0 %
EOS ABS: 0.1 10*3/uL (ref 0.0–0.5)
EOS PCT: 1 %
HCT: 32.5 % — ABNORMAL LOW (ref 36.0–46.0)
HEMOGLOBIN: 10.9 g/dL — AB (ref 12.0–15.0)
Immature Granulocytes: 1 %
LYMPHS ABS: 1.9 10*3/uL (ref 0.7–4.0)
LYMPHS PCT: 23 %
MCH: 30.9 pg (ref 26.0–34.0)
MCHC: 33.5 g/dL (ref 30.0–36.0)
MCV: 92.1 fL (ref 80.0–100.0)
MONO ABS: 0.5 10*3/uL (ref 0.1–1.0)
MONOS PCT: 6 %
NRBC: 0 % (ref 0.0–0.2)
Neutro Abs: 5.6 10*3/uL (ref 1.7–7.7)
Neutrophils Relative %: 69 %
Platelets: 223 10*3/uL (ref 150–400)
RBC: 3.53 MIL/uL — ABNORMAL LOW (ref 3.87–5.11)
RDW: 13.2 % (ref 11.5–15.5)
WBC: 8.1 10*3/uL (ref 4.0–10.5)

## 2018-02-01 LAB — CHLAMYDIA/NGC RT PCR (ARMC ONLY)
Chlamydia Tr: NOT DETECTED
N gonorrhoeae: NOT DETECTED

## 2018-02-01 LAB — URINE CYTOLOGY ANCILLARY ONLY
Chlamydia: NEGATIVE
Neisseria Gonorrhea: NEGATIVE

## 2018-02-01 LAB — RAPID HIV SCREEN (HIV 1/2 AB+AG)
HIV 1/2 ANTIBODIES: NONREACTIVE
HIV-1 P24 Antigen - HIV24: NONREACTIVE

## 2018-02-01 LAB — TYPE AND SCREEN
ABO/RH(D): O POS
ANTIBODY SCREEN: NEGATIVE

## 2018-02-01 MED ORDER — LIDOCAINE HCL 1 % IJ SOLN
30.0000 mL | Freq: Once | INTRAMUSCULAR | Status: AC
  Administered 2018-02-01: 30 mL via INTRADERMAL
  Filled 2018-02-01: qty 30

## 2018-02-01 MED ORDER — LACTATED RINGERS IV SOLN
INTRAVENOUS | Status: DC
  Administered 2018-02-01 (×3): via INTRAVENOUS

## 2018-02-01 MED ORDER — OXYTOCIN BOLUS FROM INFUSION
500.0000 mL | Freq: Once | INTRAVENOUS | Status: AC
  Administered 2018-02-01: 500 mL via INTRAVENOUS

## 2018-02-01 MED ORDER — ACETAMINOPHEN 325 MG PO TABS: 650.0000 mg | ORAL_TABLET | ORAL | Status: DC | PRN

## 2018-02-01 MED ORDER — ONDANSETRON HCL 4 MG/2ML IJ SOLN: 4.0000 mg | INTRAMUSCULAR | Status: DC | PRN

## 2018-02-01 MED ORDER — BENZOCAINE-MENTHOL 20-0.5 % EX AERO
1.0000 "application " | INHALATION_SPRAY | CUTANEOUS | Status: DC | PRN
  Filled 2018-02-01: qty 56

## 2018-02-01 MED ORDER — SENNOSIDES-DOCUSATE SODIUM 8.6-50 MG PO TABS
2.0000 | ORAL_TABLET | ORAL | Status: DC
  Administered 2018-02-02 – 2018-02-03 (×2): 2 via ORAL
  Filled 2018-02-01 (×2): qty 2

## 2018-02-01 MED ORDER — IBUPROFEN 800 MG PO TABS
800.0000 mg | ORAL_TABLET | Freq: Four times a day (QID) | ORAL | Status: DC
  Administered 2018-02-01 – 2018-02-03 (×7): 800 mg via ORAL
  Filled 2018-02-01 (×7): qty 1

## 2018-02-01 MED ORDER — TERBUTALINE SULFATE 1 MG/ML IJ SOLN
INTRAMUSCULAR | Status: AC
  Filled 2018-02-01: qty 1

## 2018-02-01 MED ORDER — BUTORPHANOL TARTRATE 2 MG/ML IJ SOLN
1.0000 mg | INTRAMUSCULAR | Status: DC | PRN
  Administered 2018-02-01: 1 mg via INTRAVENOUS

## 2018-02-01 MED ORDER — OXYTOCIN 40 UNITS IN LACTATED RINGERS INFUSION - SIMPLE MED
2.5000 [IU]/h | INTRAVENOUS | Status: DC
  Administered 2018-02-01: 2.5 [IU]/h via INTRAVENOUS
  Filled 2018-02-01: qty 1000

## 2018-02-01 MED ORDER — ONDANSETRON HCL 4 MG PO TABS: 4.0000 mg | ORAL_TABLET | ORAL | Status: DC | PRN

## 2018-02-01 MED ORDER — ACETAMINOPHEN 325 MG PO TABS
650.0000 mg | ORAL_TABLET | ORAL | Status: DC | PRN
  Administered 2018-02-02: 650 mg via ORAL
  Filled 2018-02-01: qty 2

## 2018-02-01 MED ORDER — LIDOCAINE HCL (PF) 1 % IJ SOLN
INTRAMUSCULAR | Status: AC
  Filled 2018-02-01: qty 30

## 2018-02-01 MED ORDER — DIBUCAINE 1 % RE OINT: 1.0000 "application " | TOPICAL_OINTMENT | RECTAL | Status: DC | PRN

## 2018-02-01 MED ORDER — WITCH HAZEL-GLYCERIN EX PADS: 1.0000 "application " | MEDICATED_PAD | CUTANEOUS | Status: DC | PRN

## 2018-02-01 MED ORDER — DIPHENHYDRAMINE HCL 25 MG PO CAPS: 25.0000 mg | ORAL_CAPSULE | Freq: Four times a day (QID) | ORAL | Status: DC | PRN

## 2018-02-01 MED ORDER — MISOPROSTOL 200 MCG PO TABS
ORAL_TABLET | ORAL | Status: AC
  Filled 2018-02-01: qty 4

## 2018-02-01 MED ORDER — SENNOSIDES-DOCUSATE SODIUM 8.6-50 MG PO TABS: 2.0000 | ORAL_TABLET | ORAL | Status: DC

## 2018-02-01 MED ORDER — OXYTOCIN 10 UNIT/ML IJ SOLN
INTRAMUSCULAR | Status: AC
  Filled 2018-02-01: qty 2

## 2018-02-01 MED ORDER — AMMONIA AROMATIC IN INHA
RESPIRATORY_TRACT | Status: AC
  Filled 2018-02-01: qty 10

## 2018-02-01 MED ORDER — LACTATED RINGERS IV SOLN
500.0000 mL | INTRAVENOUS | Status: DC | PRN
  Administered 2018-02-01: 500 mL via INTRAVENOUS

## 2018-02-01 MED ORDER — ZOLPIDEM TARTRATE 5 MG PO TABS: 5.0000 mg | ORAL_TABLET | Freq: Every evening | ORAL | Status: DC | PRN

## 2018-02-01 MED ORDER — SIMETHICONE 80 MG PO CHEW: 80.0000 mg | CHEWABLE_TABLET | ORAL | Status: DC | PRN

## 2018-02-01 MED ORDER — BUTORPHANOL TARTRATE 2 MG/ML IJ SOLN
INTRAMUSCULAR | Status: AC
  Filled 2018-02-01: qty 1

## 2018-02-01 MED ORDER — PRENATAL MULTIVITAMIN CH
1.0000 | ORAL_TABLET | Freq: Every day | ORAL | Status: DC
  Administered 2018-02-02 – 2018-02-03 (×2): 1 via ORAL
  Filled 2018-02-01 (×2): qty 1

## 2018-02-01 MED ORDER — MISOPROSTOL 50MCG HALF TABLET
50.0000 ug | ORAL_TABLET | ORAL | Status: DC | PRN
  Administered 2018-02-01 (×2): 50 ug via VAGINAL
  Filled 2018-02-01 (×2): qty 1

## 2018-02-01 MED ORDER — OXYTOCIN 40 UNITS IN LACTATED RINGERS INFUSION - SIMPLE MED: 1.0000 m[IU]/min | INTRAVENOUS | Status: DC

## 2018-02-01 MED ORDER — COCONUT OIL OIL: 1.0000 "application " | TOPICAL_OIL | Status: DC | PRN

## 2018-02-01 NOTE — Progress Notes (Addendum)
Intrapartum Progress Note  S: Patient feeling strong contractions, requesting IV pain medication.   O: Blood pressure 124/73, pulse 79, temperature 98.9 F (37.2 C), temperature source Oral, resp. rate 16, height 5\' 6"  (1.676 m), weight 78.9 kg, last menstrual period 04/28/2017, currently breastfeeding. Gen App: NAD, comfortable Abdomen: soft, gravid FHT: baseline 140 bpm.  Accels present.  Decels present - late deceleration x 1, variable deceleration x 1 in the past 10 minutes of tracing. moderate in degree variability.   Tocometer: contractions regular, q 1-2 minutes Cervix: 4.5/50-60/-3/AROM'd at 1615, clear fluid Extremities: Nontender, no edema.  Labs: No new labs    Assessment:  1: SIUP at [redacted]w[redacted]d 2. Cholestasis of pregnancy   Plan:  1. Continue with IOL.  Currently on no uterotonic agents. Can use Pitocin for augmentation as needed.  2. Currently in left lateral tilt, will change to right lateral tilt and give fluid bolus for current Category 2 tracing.  3. Stadol for pain relief.  Declines epidural.  4. Anticipate vaginal delivery.   [name], MD 02/01/2018 8:16 PM

## 2018-02-01 NOTE — Progress Notes (Signed)
First dose of Cytotec 50 mcg placed at 0815. Cervical exam 1/20-30/-3. Continue to monitor.   [name], MD [facility]

## 2018-02-01 NOTE — Progress Notes (Signed)
Intrapartum Progress Note  S: Patient doing well, no complaints. Just beginning to feel contractions   O: Blood pressure 118/74, pulse 76, temperature 98.5 F (36.9 C), temperature source Oral, resp. rate 16, height 5\' 6"  (1.676 m), weight 78.9 kg, last menstrual period 04/28/2017, currently breastfeeding. Gen App: NAD, comfortable Abdomen: soft, gravid FHT: baseline 135 bpm.  Accels present.  Decels absent. moderate in degree variability.   Tocometer: contractions irregular, q 2-6 minutes Cervix: 1/20/-3 Extremities: Nontender, no edema.  Labs:  Results for orders placed or performed during the hospital encounter of 02/01/18  Urinalysis, Routine w reflex microscopic  Result Value Ref Range   Color, Urine YELLOW (A) YELLOW   APPearance CLOUDY (A) CLEAR   Specific Gravity, Urine 1.013 1.005 - 1.030   pH 6.0 5.0 - 8.0   Glucose, UA NEGATIVE NEGATIVE mg/dL   Hgb urine dipstick NEGATIVE NEGATIVE   Bilirubin Urine NEGATIVE NEGATIVE   Ketones, ur NEGATIVE NEGATIVE mg/dL   Protein, ur NEGATIVE NEGATIVE mg/dL   Nitrite NEGATIVE NEGATIVE   Leukocytes, UA SMALL (A) NEGATIVE   RBC / HPF 0-5 0 - 5 RBC/hpf   WBC, UA 6-10 0 - 5 WBC/hpf   Bacteria, UA MANY (A) NONE SEEN   Squamous Epithelial / LPF 11-20 0 - 5   Mucus PRESENT   CBC with Differential/Platelet  Result Value Ref Range   WBC 8.1 4.0 - 10.5 K/uL   RBC 3.53 (L) 3.87 - 5.11 MIL/uL   Hemoglobin 10.9 (L) 12.0 - 15.0 g/dL   HCT 32.5 (L) 36.0 - 46.0 %   MCV 92.1 80.0 - 100.0 fL   MCH 30.9 26.0 - 34.0 pg   MCHC 33.5 30.0 - 36.0 g/dL   RDW 13.2 11.5 - 15.5 %   Platelets 223 150 - 400 K/uL   nRBC 0.0 0.0 - 0.2 %   Neutrophils Relative % 69 %   Neutro Abs 5.6 1.7 - 7.7 K/uL   Lymphocytes Relative 23 %   Lymphs Abs 1.9 0.7 - 4.0 K/uL   Monocytes Relative 6 %   Monocytes Absolute 0.5 0.1 - 1.0 K/uL   Eosinophils Relative 1 %   Eosinophils Absolute 0.1 0.0 - 0.5 K/uL   Basophils Relative 0 %   Basophils Absolute 0.0 0.0 - 0.1  K/uL   Immature Granulocytes 1 %   Abs Immature Granulocytes 0.04 0.00 - 0.07 K/uL  Type and screen [place]  Result Value Ref Range   ABO/RH(D) O POS    Antibody Screen NEG    Sample Expiration      02/04/2018 Performed at [facility], [address] [place], [place] [address]       Assessment:  1: SIUP at [redacted]w[redacted]d 2. Cholestasis of pregnancy   Plan:  1. Continue with IOL. Now s/p 2nd dose of Cytotec placed at 12:15 pm.  2. Continue Atarax and/or urosodeoxycholic acid as needed for itching.  3. Fetal status reassuring.   [name], MD 02/01/2018 1:44 PM

## 2018-02-01 NOTE — Progress Notes (Signed)
Intrapartum Progress Note  S: Patient doing well, no complaints. Just beginning to feel contractions   O: Blood pressure 128/84, pulse 71, temperature 98.4 F (36.9 C), temperature source Oral, resp. rate 16, height 5\' 6"  (1.676 m), weight 78.9 kg, last menstrual period 04/28/2017, currently breastfeeding. Gen App: NAD, comfortable Abdomen: soft, gravid FHT: baseline 135 bpm.  Accels present.  Decels absent. moderate in degree variability.   Tocometer: contractions regular, q 1-2 minutes Cervix: 2/40/-3 Extremities: Nontender, no edema.  Labs: No new labs    Assessment:  1: SIUP at [redacted]w[redacted]d 2. Cholestasis of pregnancy   Plan:  1. Continue with IOL. AROM'd with clear fluid. If contractions begin to taper off, will initiate Pitocin.   2. Continue Atarax and/or urosodeoxycholic acid as needed for itching.  3. Fetal status reassuring. GBS negative.   [name], MD 02/01/2018 4:40 PM

## 2018-02-02 LAB — CBC
HEMATOCRIT: 27.1 % — AB (ref 36.0–46.0)
Hemoglobin: 9 g/dL — ABNORMAL LOW (ref 12.0–15.0)
MCH: 31 pg (ref 26.0–34.0)
MCHC: 33.2 g/dL (ref 30.0–36.0)
MCV: 93.4 fL (ref 80.0–100.0)
NRBC: 0 % (ref 0.0–0.2)
PLATELETS: 176 10*3/uL (ref 150–400)
RBC: 2.9 MIL/uL — ABNORMAL LOW (ref 3.87–5.11)
RDW: 13.2 % (ref 11.5–15.5)
WBC: 10.6 10*3/uL — ABNORMAL HIGH (ref 4.0–10.5)

## 2018-02-02 LAB — RPR: RPR Ser Ql: NONREACTIVE

## 2018-02-02 NOTE — Progress Notes (Signed)
Post Partum Day # 1, s/p SVD  Subjective: up ad lib, voiding and tolerating PO.  Reports complaints of mild chest pain overnight, lasting for several minutes but has since resolved. Denies SOB.  Also noting calf pain bilaterally this morning, however right>left.   Objective: Temp:  [98.2 F (36.8 C)-99.7 F (37.6 C)] 99.1 F (37.3 C) (11/12 0842) Pulse Rate:  [71-102] 102 (11/12 0842) Resp:  [16-18] 18 (11/12 0842) BP: (111-129)/(47-84) 124/72 (11/12 0842) SpO2:  [96 %-100 %] 98 % (11/12 0842)  Physical Exam:  General: alert and no distress  Lungs: clear to auscultation bilaterally Breasts: normal appearance, no masses or tenderness Heart: regular rate and rhythm, S1, S2 normal, no murmur, click, rub or gallop Abdomen: soft, non-tender; bowel sounds normal; no masses,  no organomegaly Pelvis: Lochia: appropriate, Uterine Fundus: firm Extremities: DVT Evaluation: No evidence of DVT seen on physical exam. Negative Homan's sign. No cords or calf tenderness. No significant calf/ankle edema.  Recent Labs    02/01/18 0643 02/02/18 0559  HGB 10.9* 9.0*  HCT 32.5* 27.1*    Assessment/Plan: Declines circumcision for female infant Anemia of pregnancy, asymptomatic. Will treat with daily PO iron.  Breastfeeding Contraception: undecided Chest pain, 1 occurrence, no longer symptomatic. Will continue to monitor. Normal heart and lung exam, and VSS. Calf pain with no evidence of DVT on exam. Will also continue to monitor.   Plan for discharge tomorrow   LOS: 1 day   [name], MD [facility] 02/02/2018 8:48 AM

## 2018-02-03 MED ORDER — DOCUSATE SODIUM 100 MG PO CAPS: 100.0000 mg | ORAL_CAPSULE | Freq: Every day | ORAL | 2 refills | Status: AC | PRN

## 2018-02-03 MED ORDER — IBUPROFEN 800 MG PO TABS: 800.0000 mg | ORAL_TABLET | Freq: Four times a day (QID) | ORAL | 0 refills | Status: DC

## 2018-02-03 MED ORDER — FERROUS SULFATE 325 (65 FE) MG PO TBEC: 325.0000 mg | DELAYED_RELEASE_TABLET | Freq: Three times a day (TID) | ORAL | 2 refills | Status: DC

## 2018-02-03 NOTE — Discharge Summary (Signed)
OB Discharge Summary     Patient Name: Yvonne Morrison DOB: 1996-01-27 MRN: [id number]  Date of admission: 02/01/2018 Delivering MD: [name]   Date of discharge: 02/03/2018  Admitting diagnosis: 37 wks Induction Intrauterine pregnancy: [redacted]w[redacted]d     Secondary diagnosis:  Active Problems:   History of preterm delivery, currently pregnant   Cholestasis during pregnancy in third trimester   Anemia of pregnancy in third trimester  Additional problems: Anxiety     Discharge diagnosis: Term Pregnancy Delivered and Cholestasis of pregnancy                                                                                                Post partum procedures:None  Augmentation: Cytotec  Complications: None  Hospital course:  Induction of Labor With Vaginal Delivery   22 y.o. yo [phone] at [redacted]w[redacted]d was admitted to the hospital 02/01/2018 for induction of labor.  Indication for induction: Cholestasis of pregnancy.  Patient had an uncomplicated labor course as follows: Membrane Rupture Time/Date: 4:36 PM ,02/01/2018   Intrapartum Procedures: Episiotomy:                                           Lacerations:  Perineal [11];2nd degree [3]  Patient had delivery of a Viable infant.  Information for the patient's newborn:  Chaise, Passarella [[id number]]  Delivery Method: Vag-Spont   02/01/2018  Details of delivery can be found in separate delivery note.  Patient had a routine postpartum course. Patient is discharged home 02/03/18.  Physical exam  Vitals:   02/02/18 1123 02/02/18 1954 02/02/18 2325 02/03/18 0836  BP: 116/68 113/75 109/78 110/66  Pulse: 99 89 82 85  Resp: 20 18 18 18   Temp: 98.5 F (36.9 C) 98.6 F (37 C) 98.3 F (36.8 C) 98 F (36.7 C)  TempSrc: Oral Oral Oral Oral  SpO2: 100% 97% 100% 99%  Weight:      Height:       General: alert and no distress Lochia: appropriate Uterine Fundus: firm Incision: None DVT Evaluation: No evidence of DVT seen on physical  exam. Negative Homan's sign. No cords or calf tenderness. No significant calf/ankle edema.  Labs: Lab Results  Component Value Date   WBC 10.6 (H) 02/02/2018   HGB 9.0 (L) 02/02/2018   HCT 27.1 (L) 02/02/2018   MCV 93.4 02/02/2018   PLT 176 02/02/2018   CMP Latest Ref Rng & Units 07/14/2017  Glucose 65 - 99 mg/dL -  BUN 6 - 20 mg/dL -  Creatinine 0.57 - 1.00 mg/dL -  Sodium 134 - 144 mmol/L -  Potassium 3.5 - 5.2 mmol/L -  Chloride 96 - 106 mmol/L -  CO2 18 - 29 mmol/L -  Calcium 8.7 - 10.2 mg/dL -  Total Protein 6.0 - 8.5 g/dL 7.6  Total Bilirubin 0.0 - 1.2 mg/dL 0.4  Alkaline Phos 39 - 117 IU/L 68  AST 0 - 40 IU/L 12  ALT 0 - 32 IU/L 9  Discharge instruction: per After Visit Summary and "Baby and Me Booklet".  After visit meds:  Allergies as of 02/03/2018   No Known Allergies     Medication List    STOP taking these medications   lidocaine 2 % jelly Commonly known as:  XYLOCAINE   ursodiol 500 MG tablet Commonly known as:  ACTIGALL     TAKE these medications   docusate sodium 100 MG capsule Commonly known as:  COLACE Take 1 capsule (100 mg total) by mouth daily as needed.   ferrous sulfate 325 (65 FE) MG EC tablet Take 1 tablet (325 mg total) by mouth 3 (three) times daily with meals.   ibuprofen 800 MG tablet Commonly known as:  ADVIL,MOTRIN Take 1 tablet (800 mg total) by mouth every 6 (six) hours.   multivitamin-prenatal 27-0.8 MG Tabs tablet Take 1 tablet by mouth daily at 12 noon.       Diet: routine diet  Activity: Advance as tolerated. Pelvic rest for 6 weeks.   Outpatient follow up:6 weeks Follow up Appt:No future appointments. Follow up Visit:No follow-ups on file.  Postpartum contraception: Tubal Ligation (interval)  Newborn Data: Live born female  Birth Weight: 6 lb 9.8 oz (3000 g) APGAR: 8, 9  Newborn Delivery   Birth date/time:  02/01/2018 21:02:00 Delivery type:  Vaginal, Spontaneous     Baby Feeding:  Breast Disposition:home with mother   02/03/2018 [name], MD

## 2018-02-03 NOTE — Progress Notes (Signed)
Pt discharged with infant.  Discharge instructions, prescriptions and follow up appointment given to and reviewed with pt. Pt verbalized understanding. Escorted out by auxillary. 

## 2018-03-01 ENCOUNTER — Encounter: Payer: Medicaid Other | Admitting: Obstetrics and Gynecology

## 2018-03-19 ENCOUNTER — Ambulatory Visit (INDEPENDENT_AMBULATORY_CARE_PROVIDER_SITE_OTHER): Payer: Medicaid Other | Admitting: Obstetrics and Gynecology

## 2018-03-19 ENCOUNTER — Encounter: Payer: Medicaid Other | Admitting: Obstetrics and Gynecology

## 2018-03-19 ENCOUNTER — Encounter: Payer: Self-pay | Admitting: Obstetrics and Gynecology

## 2018-03-19 DIAGNOSIS — Z30430 Encounter for insertion of intrauterine contraceptive device: Secondary | ICD-10-CM

## 2018-03-19 DIAGNOSIS — Z3202 Encounter for pregnancy test, result negative: Secondary | ICD-10-CM

## 2018-03-19 DIAGNOSIS — G43009 Migraine without aura, not intractable, without status migrainosus: Secondary | ICD-10-CM

## 2018-03-19 DIAGNOSIS — O9081 Anemia of the puerperium: Secondary | ICD-10-CM

## 2018-03-19 LAB — POCT URINE PREGNANCY: PREG TEST UR: NEGATIVE

## 2018-03-19 MED ORDER — BUTALBITAL-APAP-CAFFEINE 50-325-40 MG PO CAPS: 1.0000 | ORAL_CAPSULE | Freq: Four times a day (QID) | ORAL | 3 refills | Status: DC | PRN

## 2018-03-19 MED ORDER — BUTALBITAL-APAP-CAFFEINE 50-325-40 MG PO CAPS: 1.0000 | ORAL_CAPSULE | Freq: Four times a day (QID) | ORAL | 3 refills | Status: AC | PRN

## 2018-03-19 NOTE — Patient Instructions (Signed)
Intrauterine Device Insertion, Care After    This sheet gives you information about how to care for yourself after your procedure. Your health care provider may also give you more specific instructions. If you have problems or questions, contact your health care provider.  What can I expect after the procedure?  After the procedure, it is common to have:  · Cramps and pain in the abdomen.  · Light bleeding (spotting) or heavier bleeding that is like your menstrual period. This may last for up to a few days.  · Lower back pain.  · Dizziness.  · Headaches.  · Nausea.  Follow these instructions at home:  · Before resuming sexual activity, check to make sure that you can feel the IUD string(s). You should be able to feel the end of the string(s) below the opening of your cervix. If your IUD string is in place, you may resume sexual activity.  ? If you had a hormonal IUD inserted more than 7 days after your most recent period started, you will need to use a backup method of birth control for 7 days after IUD insertion. Ask your health care provider whether this applies to you.  · Continue to check that the IUD is still in place by feeling for the string(s) after every menstrual period, or once a month.  · Take over-the-counter and prescription medicines only as told by your health care provider.  · Do not drive or use heavy machinery while taking prescription pain medicine.  · Keep all follow-up visits as told by your health care provider. This is important.  Contact a health care provider if:  · You have bleeding that is heavier or lasts longer than a normal menstrual cycle.  · You have a fever.  · You have cramps or abdominal pain that get worse or do not get better with medicine.  · You develop abdominal pain that is new or is not in the same area of earlier cramping and pain.  · You feel lightheaded or weak.  · You have abnormal or bad-smelling discharge from your vagina.  · You have pain during sexual  activity.  · You have any of the following problems with your IUD string(s):  ? The string bothers or hurts you or your sexual partner.  ? You cannot feel the string.  ? The string has gotten longer.  · You can feel the IUD in your vagina.  · You think you may be pregnant, or you miss your menstrual period.  · You think you may have an STI (sexually transmitted infection).  Get help right away if:  · You have flu-like symptoms.  · You have a fever and chills.  · You can feel that your IUD has slipped out of place.  Summary  · After the procedure, it is common to have cramps and pain in the abdomen. It is also common to have light bleeding (spotting) or heavier bleeding that is like your menstrual period.  · Continue to check that the IUD is still in place by feeling for the string(s) after every menstrual period, or once a month.  · Keep all follow-up visits as told by your health care provider. This is important.  · Contact your health care provider if you have problems with your IUD string(s), such as the string getting longer or bothering you or your sexual partner.  This information is not intended to replace advice given to you by your health care provider. Make   sure you discuss any questions you have with your health care provider.  Document Released: 11/06/2010 Document Revised: 01/30/2016 Document Reviewed: 01/30/2016  Elsevier Interactive Patient Education © 2019 [employer]

## 2018-03-19 NOTE — Addendum Note (Signed)
Addended by: Augusto Gamble on: 03/19/2018 10:41 PM   Modules accepted: Orders

## 2018-03-19 NOTE — Progress Notes (Signed)
PT is present today for her postpartum visit. Pt stated that she is breastfeeding and have not had sexually intercourse recently. Pt stated that she would like to get the Mirena or patches for birth control. EPDS=5 .  Pt stated that she is having headaches daily that will not go away. Pt stated that she has been taking tylenol but it is not helping.

## 2018-03-19 NOTE — Progress Notes (Signed)
OBSTETRICS POSTPARTUM CLINIC PROGRESS NOTE  Subjective:     Yvonne Morrison is a 22 y.o. [phone] female who presents for a postpartum visit. She is 6 weeks postpartum following a spontaneous vaginal delivery. I have fully reviewed the prenatal and intrapartum course. The delivery was at 71 gestational weeks, induction for cholestasis of pregnancy.  Anesthesia: none. Postpartum course has been well. Baby's course has been complicated by dimpling area on back (is to be seen at [facility] for further evaluation). Baby is feeding by breast. Bleeding: patient has/has not resumed menses, with No LMP recorded.  Bowel function is normal. Bladder function is normal. Patient is not se yetually active. Contraception method desired is undecided (initially desired BTL after delivery however had not signed Medicaid forms. Now considering contraceptive patch or IUD). Postpartum depression screening: negative (EPDS score is 5).  The following portions of the patient's history were reviewed and updated as appropriate: allergies, current medications, past family history, past medical history, past social history, past surgical history and problem list.  Review of Systems A comprehensive review of systems was negative except for: Neurological: positive for headaches not relieved by OTC Tylenol. Patient reports history of occasional headaches in the past, however these have been different. Feels as though it is behind the eyes, notes light sensitivity.   Objective:    BP 106/69   Pulse 73   Ht 5\' 6"  (1.676 m)   Wt 161 lb 1.6 oz (73.1 kg)   Breastfeeding Yes   BMI 26.00 kg/m   General:  alert and no distress   Breasts:  inspection negative, no nipple discharge or bleeding, no masses or nodularity palpable  Lungs: clear to auscultation bilaterally  Heart:  regular rate and rhythm, S1, S2 normal, no murmur, click, rub or gallop  Abdomen: soft, non-tender; bowel sounds normal; no masses,  no organomegaly.    Vulva:  normal   Vagina: normal vagina, no discharge, exudate, lesion, or erythema  Cervix:  no cervical motion tenderness and no lesions  Corpus: normal size, contour, position, consistency, mobility, non-tender  Adnexa:  normal adnexa and no mass, fullness, tenderness  Rectal Exam: Not performed.         Labs:  Lab Results  Component Value Date   HGB 9.0 (L) 02/02/2018     Assessment:   Routine postpartum exam.   Mild postpartum anemia History of anxiety Migraines  Plan:   1. Contraception: Reviewed all forms of birth control options available including abstinence; fertility period awareness methods; over the counter/barrier methods; hormonal contraceptive medication including pill, patch, ring, injection,contraceptive implant; hormonal and nonhormonal IUDs; permanent sterilization options including vasectomy and the various tubal sterilization modalities. Risks and benefits reviewed.  Questions were answered. Patient now desires to try IUD.  Liletta IUD inserted today (see insertion note below). Information was given to patient to review.  2. Will check Hgb for h/o anemia.  3. Patient with new type of headache, possibly migraine.  Discussed use of Excedrin Migraine, Goody's BC powder OTC.  If still no relief, also prescribed Fioricet which she can try.  4. H/o anxiety, patient denies symptoms today. Is not taking the Atarax regularly.  5. Follow up in: 1 month for IUD check.  Return sooner as needed.         GYNECOLOGY OFFICE PROCEDURE NOTE  Domnique Vanegas is a 22 y.o. [phone] here for [place] IUD insertion. No GYN concerns.  Last pap smear was on 06/2015 and was normal.  IUD Insertion Procedure Note  Patient identified, informed consent performed, consent signed.   Discussed risks of irregular bleeding, cramping, infection, malpositioning or misplacement of the IUD outside the uterus which may require further procedure such as laparoscopy. Time out was performed.  Urine pregnancy test  negative.  Speculum placed in the vagina.  Cervix visualized.  Cleaned with Betadine x 2.  Grasped anteriorly with a single tooth tenaculum.  Uterus sounded to 8 cm.  Liletta IUD placed per manufacturer's recommendations.  Strings trimmed to 3 cm. Tenaculum was removed, good hemostasis noted.  Patient tolerated procedure well.   Patient was given post-procedure instructions.  She was advised to have backup contraception for one week.  Patient was also asked to check IUD strings periodically and follow up in 4 weeks for IUD check.    [name], MD [facility]

## 2018-03-20 LAB — HEMOGLOBIN AND HEMATOCRIT, BLOOD
HEMATOCRIT: 35.2 % (ref 34.0–46.6)
HEMOGLOBIN: 11.5 g/dL (ref 11.1–15.9)

## 2018-04-16 ENCOUNTER — Encounter: Payer: Self-pay | Admitting: Obstetrics and Gynecology

## 2018-04-16 ENCOUNTER — Ambulatory Visit (INDEPENDENT_AMBULATORY_CARE_PROVIDER_SITE_OTHER): Payer: Medicaid Other | Admitting: Obstetrics and Gynecology

## 2018-04-16 VITALS — BP 104/73 | HR 90 | Ht 66.0 in | Wt 161.9 lb

## 2018-04-16 DIAGNOSIS — Z30431 Encounter for routine checking of intrauterine contraceptive device: Secondary | ICD-10-CM

## 2018-04-16 DIAGNOSIS — Z30016 Encounter for initial prescription of transdermal patch hormonal contraceptive device: Secondary | ICD-10-CM

## 2018-04-16 DIAGNOSIS — R102 Pelvic and perineal pain: Secondary | ICD-10-CM

## 2018-04-16 DIAGNOSIS — Z30432 Encounter for removal of intrauterine contraceptive device: Secondary | ICD-10-CM

## 2018-04-16 DIAGNOSIS — K5900 Constipation, unspecified: Secondary | ICD-10-CM

## 2018-04-16 MED ORDER — NORELGESTROMIN-ETH ESTRADIOL 150-35 MCG/24HR TD PTWK: 1.0000 | MEDICATED_PATCH | TRANSDERMAL | 3 refills | Status: DC

## 2018-04-16 NOTE — Patient Instructions (Signed)

## 2018-04-16 NOTE — Progress Notes (Signed)
     GYNECOLOGY OFFICE ENCOUNTER NOTE  History:  23 y.o. [name] here today for today for IUD string check; Liletta  IUD was placed  1 month ago.  She notes that she would like to have the IUD removed today since not feeling well since she got iti.  She reports constipation that is not helped with Colace.  She also notes that she has been bleeding for the past 2 weeks on her cycle, and that it does not appear to want to stop anytimie soon.  Additionally, she his having vaginal pain on and off, and is also experiencing pain during intercourse.  Lastly, she is noting that she started having sharp pains in the abdominal area since last night.    The following portions of the patient's history were reviewed and updated as appropriate: allergies, current medications, past family history, past medical history, past social history, past surgical history and problem list. Last pap smear on 06/2015 was normal, negative HRHPV.  Review of Systems:  Pertinent items are noted in HPI.   Objective:  Physical Exam Blood pressure 104/73, pulse 90, height 5\' 6"  (1.676 m), weight 161 lb 14.4 oz (73.4 kg), last menstrual period 04/01/2018, currently breastfeeding. CONSTITUTIONAL: Well-developed, well-nourished female in no acute distress.  HENT:  Normocephalic, atraumatic. External right and left ear normal. Oropharynx is clear and moist EYES: Conjunctivae and EOM are normal. Pupils are equal, round, and reactive to light. No scleral icterus.  ABDOMEN: Soft, no distention noted.   PELVIC: Normal appearing external genitalia; normal appearing vaginal mucosa and cervix.  IUD strings visualized, about 3 cm in length outside cervix.  EXTREMITIES: extremities normal, atraumatic, no cyanosis or edema NEUROLOGIC: Grossly normal   Assessment:   IUD check up Encounter for IUD removal Encounter for initial prescription of transdermal patch hormonal contraceptive device Pelvic pain Constipation, unspecified  constipation type   Plan:    - Can perform ultrasound to verify proper IUD placement prior to removal, however patient notes that she would just like IUD removed, desires to resume contraceptive patches. Removal performed today (see procedure note below). Advised that if pain and bleeding continues despite IUD removal, she should return for further workup of her symptoms.  - Can use Miralax for constipation or milk of magnesia.     GYNECOLOGY OFFICE PROCEDURE NOTE  Yvonne Morrison is a 23 y.o. [phone] here for [place] IUD removal.     IUD Removal  Patient identified, informed consent performed, consent signed.  Patient was in the dorsal lithotomy position, normal external genitalia was noted.  A speculum was placed in the patient's vagina, normal discharge was noted, no lesions. The cervix was visualized, no lesions, no abnormal discharge.  The strings of the IUD were grasped and pulled using ring forceps. The IUD was removed in its entirety.  Patient tolerated the procedure well.    Patient will use Xulane patches for contraception.  She was advised to use a backup method for 2 weeks.   Routine preventative health maintenance measures emphasized.    Yvonne Maid, MD [facility]

## 2018-04-16 NOTE — Progress Notes (Signed)
Pt present today for IUD check. Pt stated that she would like to get the IUD removed due to not feeling well since getting it. Pt stated that she has been constipated and the colace is not helping. Pt stated that she has been on her cycle for 2 weeks with the start date of 04/01/18 and her cycle is still on. Pt stated that she is having vaginal pain off and on and notice it during sex as well. Pt stated that she noticed vaginal pain after getting the IUD. Pt stated last night all today she has been noticing sharp pains in the lower abd area.

## 2018-06-26 ENCOUNTER — Encounter: Payer: Self-pay | Admitting: Emergency Medicine

## 2018-06-26 ENCOUNTER — Emergency Department
Admission: EM | Admit: 2018-06-26 | Discharge: 2018-06-26 | Disposition: A | Discharge status: Home / Self Care | Payer: Medicaid Other | Attending: Emergency Medicine | Admitting: Emergency Medicine

## 2018-06-26 ENCOUNTER — Other Ambulatory Visit: Payer: Self-pay

## 2018-06-26 ENCOUNTER — Emergency Department: Payer: Medicaid Other

## 2018-06-26 DIAGNOSIS — Y999 Unspecified external cause status: Secondary | ICD-10-CM | POA: Insufficient documentation

## 2018-06-26 DIAGNOSIS — S93402A Sprain of unspecified ligament of left ankle, initial encounter: Secondary | ICD-10-CM | POA: Insufficient documentation

## 2018-06-26 DIAGNOSIS — Y9389 Activity, other specified: Secondary | ICD-10-CM | POA: Insufficient documentation

## 2018-06-26 DIAGNOSIS — X500XXA Overexertion from strenuous movement or load, initial encounter: Secondary | ICD-10-CM | POA: Insufficient documentation

## 2018-06-26 DIAGNOSIS — Y929 Unspecified place or not applicable: Secondary | ICD-10-CM | POA: Insufficient documentation

## 2018-06-26 MED ORDER — MELOXICAM 15 MG PO TABS: 15.0000 mg | ORAL_TABLET | Freq: Every day | ORAL | 1 refills | Status: AC

## 2018-06-26 NOTE — ED Notes (Signed)
Pt was walking in her yard and her dog bolted between her feet causing her to fall and twist her L ankle. Pt indicates that pain is spread along dorsal aspect of Left foot ranging from medial to lateral maleolus. Pt A&Ox4, NAD, no swelling evident. Distal pulses intact, cap refill <3 sec.

## 2018-06-26 NOTE — ED Provider Notes (Signed)
[facility] Emergency Department Provider Note  ____________________________________________  Time seen: Approximately 10:38 PM  I have reviewed the triage vital signs and the nursing notes.   HISTORY  Chief Complaint Ankle Injury    HPI Yvonne Morrison is a 23 y.o. female presents to the emergency department with acute 8/10 left ankle pain.  Patient reports that she sustained an inversion type ankle injury after her dog tripped her.  Patient has had no prior left ankle fractures in the past or sprains.  No abrasions or lacerations.  Patient did not hit her head or neck during fall. No alleviating measures have been attempted.         Past Medical History:  Diagnosis Date  . Anxiety   . Maternal renal lithiasis, history of    2015, during 1st pregnancy  . Rectal bleeding     Patient Active Problem List   Diagnosis Date Noted  . Cholestasis during pregnancy in third trimester 02/01/2018  . Anemia of pregnancy in third trimester 02/01/2018  . History of preterm delivery, currently pregnant 08/18/2017  . Maternal hypotension syndrome in second trimester 08/18/2017  . Patient is a currently breast-feeding mother 12/02/2016  . Amenorrhea 12/02/2016  . History of cholestasis during pregnancy 12/02/2016  . Anxiety 09/08/2015  . Panic attack as reaction to stress 09/08/2015  . Maternal renal lithiasis, history of     Past Surgical History:  Procedure Laterality Date  . NO PAST SURGERIES      Prior to Admission medications   Medication Sig Start Date End Date Taking? Authorizing Provider  Butalbital-APAP-Caffeine 50-325-40 MG capsule Take 1-2 capsules by mouth every 6 (six) hours as needed for headache. 03/19/18   [name], MD  docusate sodium (COLACE) 100 MG capsule Take 1 capsule (100 mg total) by mouth daily as needed. 02/03/18 02/03/19  [name], MD  meloxicam (MOBIC) 15 MG tablet Take 1 tablet (15 mg total) by mouth daily for 7 days. 06/26/18  07/03/18  Lannie Fields, PA-C  norelgestromin-ethinyl estradiol Marilu Favre) 150-35 MCG/24HR transdermal patch Place 1 patch onto the skin once a week. 04/16/18   [name], MD  Prenatal Vit-Fe Fumarate-FA (MULTIVITAMIN-PRENATAL) 27-0.8 MG TABS tablet Take 1 tablet by mouth daily at 12 noon.    [provider]    Allergies Patient has no known allergies.  Family History  Problem Relation Age of Onset  . Diabetes Father   . Prostate cancer Father   . Cancer Neg Hx   . Heart disease Neg Hx     Social History Social History   Tobacco Use  . Smoking status: Never Smoker  . Smokeless tobacco: Never Used  Substance Use Topics  . Alcohol use: Not Currently    Alcohol/week: 0.0 standard drinks    Comment: occass  . Drug use: No     Review of Systems  Constitutional: No fever/chills Eyes: No visual changes. No discharge ENT: No upper respiratory complaints. Cardiovascular: no chest pain. Respiratory: no cough. No SOB. Gastrointestinal: No abdominal pain.  No nausea, no vomiting.  No diarrhea.  No constipation. Genitourinary: Negative for dysuria. No hematuria Musculoskeletal: Patient has left ankle pai.  Skin: Negative for rash, abrasions, lacerations, ecchymosis. Neurological: Negative for headaches, focal weakness or numbness.   ____________________________________________   PHYSICAL EXAM:  VITAL SIGNS: ED Triage Vitals  Enc Vitals Group     BP 06/26/18 1922 110/66     Pulse Rate 06/26/18 1922 97     Resp 06/26/18 1922  14     Temp 06/26/18 1922 98.3 F (36.8 C)     Temp Source 06/26/18 1922 Oral     SpO2 06/26/18 1922 100 %     Weight 06/26/18 1923 160 lb (72.6 kg)     Height 06/26/18 1923 5\' 6"  (1.676 m)     Head Circumference --      Peak Flow --      Pain Score 06/26/18 1923 8     Pain Loc --      Pain Edu? --      Excl. in [place]? --      Constitutional: Alert and oriented. Well appearing and in no acute distress. Eyes: Conjunctivae are normal.  PERRL. EOMI. Head: Atraumatic. Cardiovascular: Normal rate, regular rhythm. Normal S1 and S2.  Good peripheral circulation. Respiratory: Normal respiratory effort without tachypnea or retractions. Lungs CTAB. Good air entry to the bases with no decreased or absent breath sounds. Musculoskeletal: Patient had tenderness to palpation over the left anterior and posterior talofibular ligaments and deltoid ligament.  She is unable to perform full range of motion at the left ankle, likely secondary to pain. Palpable dorsalis pedis pulse, left.  Neurologic:  Normal speech and language. No gross focal neurologic deficits are appreciated.  Skin:  Skin is warm, dry and intact. No rash noted. Psychiatric: Mood and affect are normal. Speech and behavior are normal. Patient exhibits appropriate insight and judgement.   ____________________________________________   LABS (all labs ordered are listed, but only abnormal results are displayed)  Labs Reviewed - No data to display ____________________________________________  EKG   ____________________________________________  RADIOLOGY I personally viewed and evaluated these images as part of my medical decision making, as well as reviewing the written report by the radiologist.  Dg Ankle Complete Left  Result Date: 06/26/2018 CLINICAL DATA:  Fall with twisting left ankle injury today. EXAM: LEFT ANKLE COMPLETE - 3+ VIEW COMPARISON:  None. FINDINGS: Ankle mortise is normal. No evidence of acute fracture or dislocation. IMPRESSION: No acute findings. Electronically Signed   By: Marin Olp M.D.   On: 06/26/2018 19:49   Dg Foot Complete Left  Result Date: 06/26/2018 CLINICAL DATA:  Fall with twisting left ankle injury today. EXAM: LEFT FOOT - COMPLETE 3+ VIEW COMPARISON:  None. FINDINGS: There is no evidence of fracture or dislocation. There is no evidence of arthropathy or other focal bone abnormality. Soft tissues are unremarkable. IMPRESSION: Negative.  Electronically Signed   By: Marin Olp M.D.   On: 06/26/2018 19:49    ____________________________________________    PROCEDURES  Procedure(s) performed:    Procedures    Medications - No data to display   ____________________________________________   INITIAL IMPRESSION / ASSESSMENT AND PLAN / ED COURSE  Pertinent labs & imaging results that were available during my care of the patient were reviewed by me and considered in my medical decision making (see chart for details).  Review of the [place] CSRS was performed in accordance of the [place] prior to dispensing any controlled drugs.           Assessment and plan Left ankle sprain Patient presents to the emergency department with acute left ankle pain after sustaining an inversion type ankle injury.  On physical exam, patient had tenderness over the anterior and posterior talofibular ligaments and deltoid ligament.  No gross effusion was visualized.  Rest, ice, compression elevation were recommended.  Crutches were provided at discharge and patient was prescribed meloxicam for pain and inflammation.  Patient  was advised to follow-up with podiatry of left ankle pain persist.  All patient questions were answered.     ____________________________________________  FINAL CLINICAL IMPRESSION(S) / ED DIAGNOSES  Final diagnoses:  Sprain of left ankle, unspecified ligament, initial encounter      NEW MEDICATIONS STARTED DURING THIS VISIT:  ED Discharge Orders         Ordered    meloxicam (MOBIC) 15 MG tablet  Daily     06/26/18 2054              This chart was dictated using voice recognition software/Dragon. Despite best efforts to proofread, errors can occur which can change the meaning. Any change was purely unintentional.    Lannie Fields, PA-C 06/26/18 2243    Carrie Mew, MD 06/27/18 2241

## 2018-06-26 NOTE — ED Triage Notes (Signed)
Pt to ED from home c/o left ankle pain states was taking out trash when her dog ran between legs knocking her over.  No obvious deformity noted, pedal pulse palpated, skin warm and dry.

## 2018-06-26 NOTE — ED Notes (Signed)
Ace wrap applied to L ankle. Pt educated on appropriate use and application. Crutches appropriately sized and pt educated on appropriate method of use. Pt verbalized understanding and demonstrated skills.

## 2019-02-14 ENCOUNTER — Other Ambulatory Visit: Payer: Self-pay | Admitting: Obstetrics and Gynecology

## 2019-02-21 ENCOUNTER — Other Ambulatory Visit: Payer: Self-pay

## 2019-02-21 MED ORDER — XULANE 150-35 MCG/24HR TD PTWK: MEDICATED_PATCH | TRANSDERMAL | 4 refills | Status: DC

## 2019-03-20 IMAGING — US US OB TRANSVAGINAL
1 series · 13 of 28 positions shown · non-contrast
Comparison: Pelvic ultrasound performed 10/08/2013

CLINICAL DATA: Acute onset of vaginal discharge.

EXAM:
OBSTETRIC <14 WK US AND TRANSVAGINAL OB US
TECHNIQUE: Both transabdominal and transvaginal ultrasound examinations were
performed for complete evaluation of the gestation as well as the
maternal uterus, adnexal regions, and pelvic cul-de-sac.
Transvaginal technique was performed to assess early pregnancy.

[Series 1: us ob transvaginal · 13 of 115 slices shown]
[im 5/115]
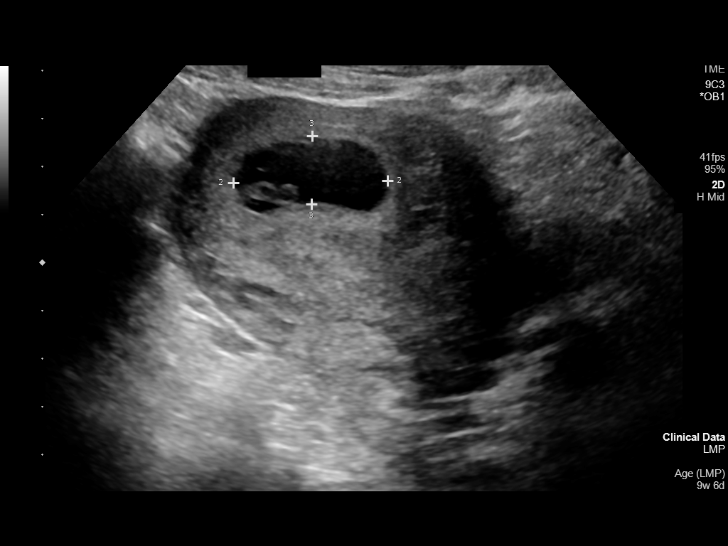
[im 13/115]
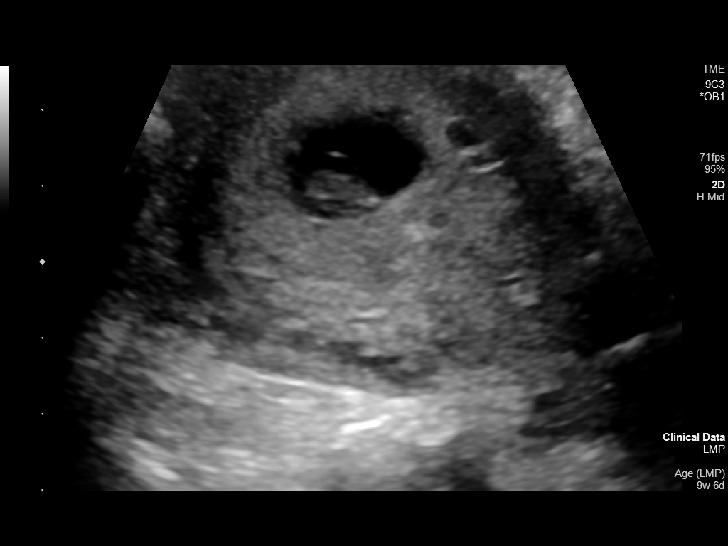
[im 22/115]
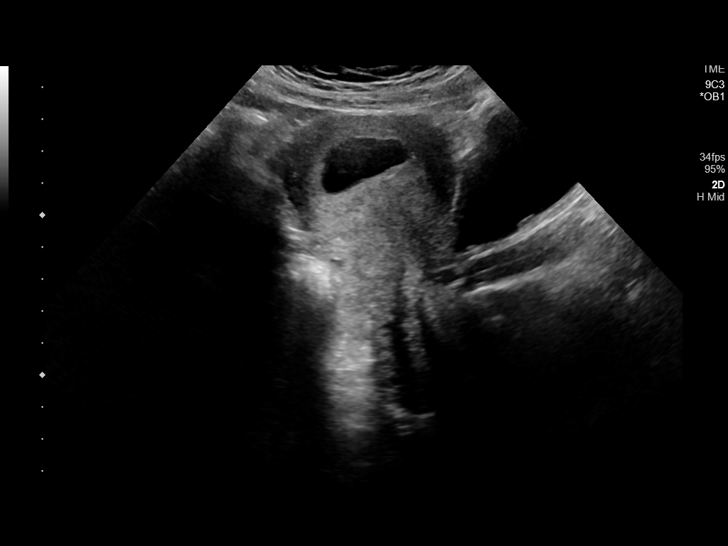
[im 30/115]
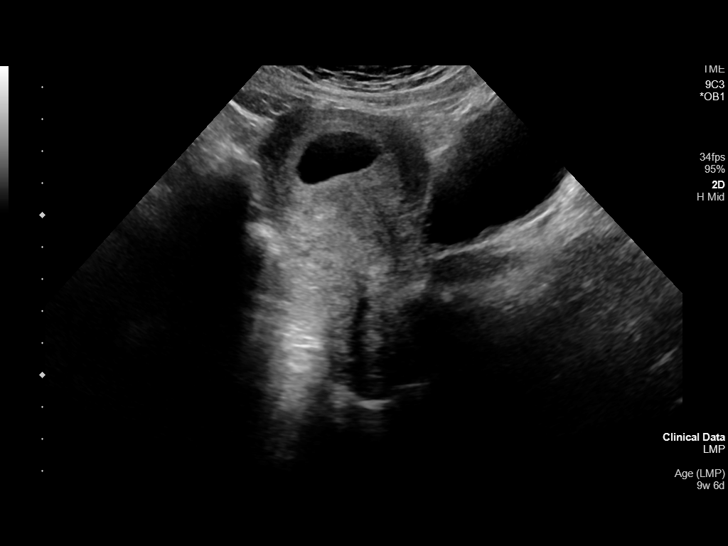
[im 39/115]
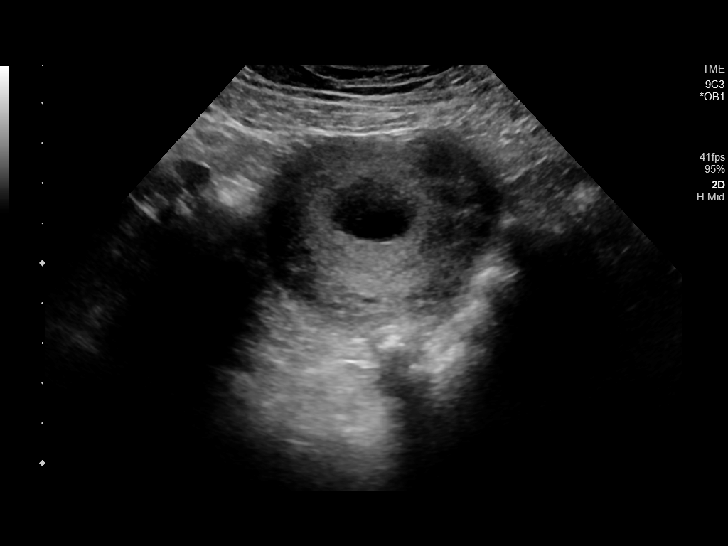
[im 47/115]
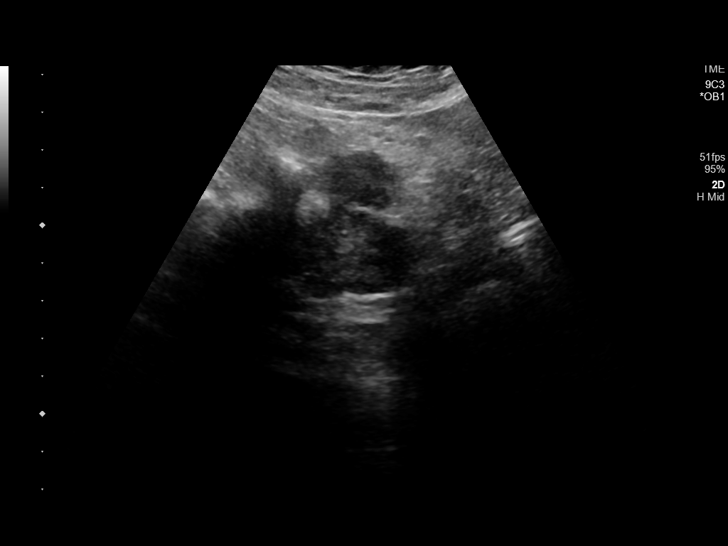
[im 60/115]
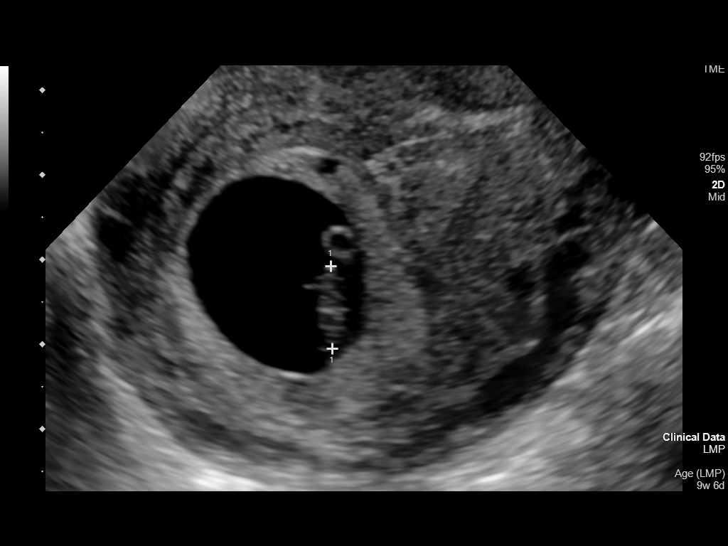
[im 68/115]
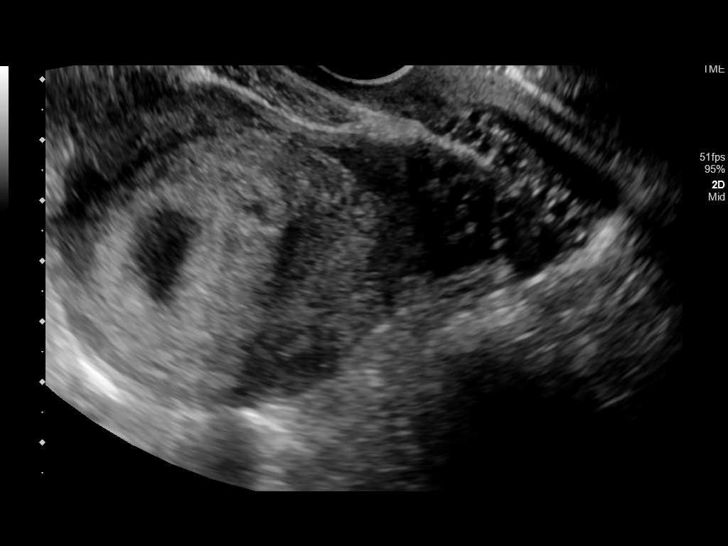
[im 77/115]
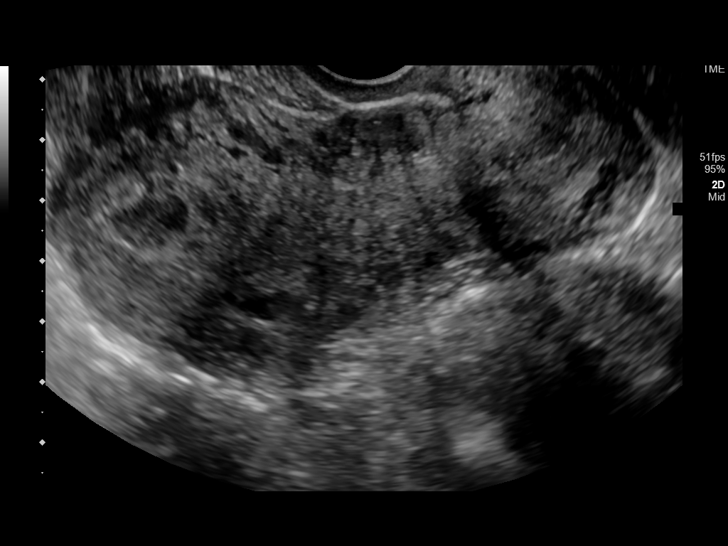
[im 85/115]
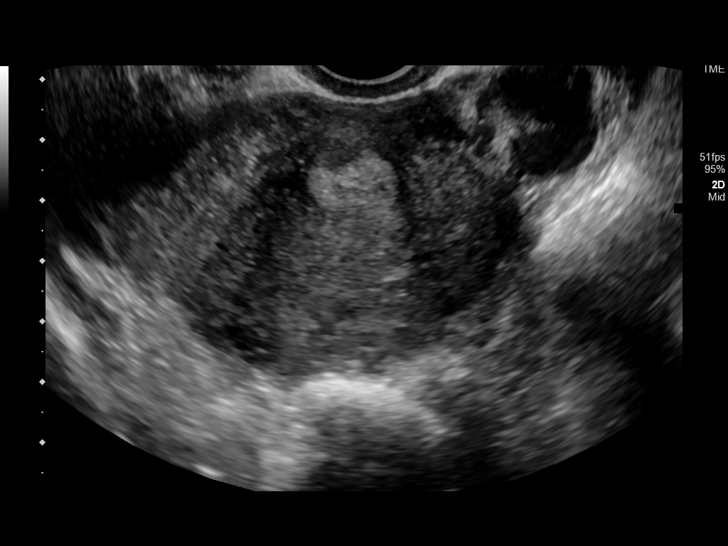
[im 93/115]
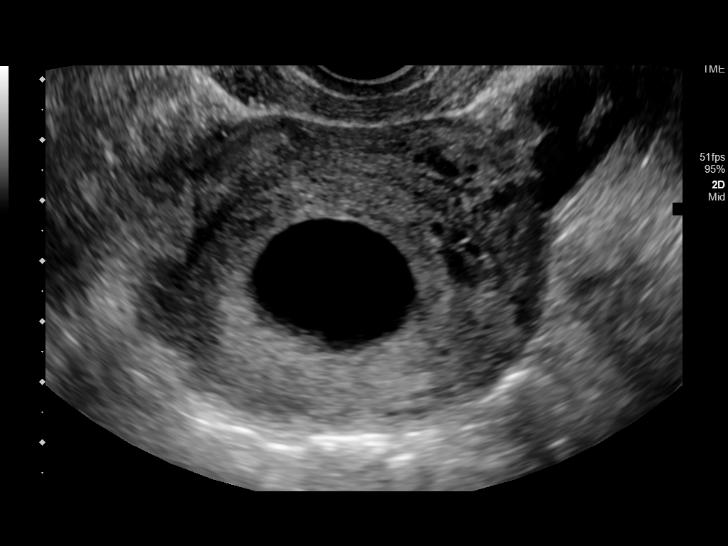
[im 102/115]
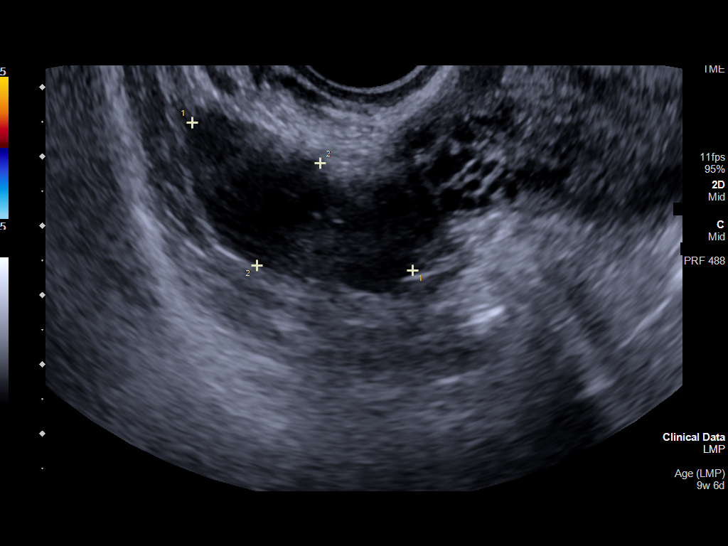
[im 110/115]
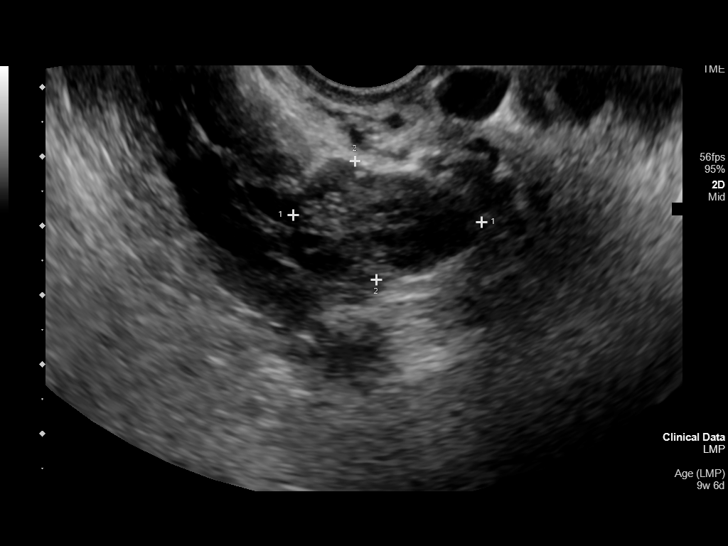

[13 of 28 positions shown; findings below may reference images not displayed]

FINDINGS: Intrauterine gestational sac: Single; visualized and normal in
shape.

Yolk sac:  Yes

Embryo:  Yes

Cardiac Activity: Yes

Heart Rate: 139 bpm

CRL: 9.7  mm   7 w   0 d                  US EDC: 02/22/2018

Subchorionic hemorrhage:  None visualized.

Maternal uterus/adnexae: The uterus is otherwise unremarkable.

The ovaries are within normal limits. The right ovary measures 3.8 x
1.7 x 1.7 cm, while the left ovary measures 2.7 x 1.7 x 1.6 cm. No
suspicious adnexal masses are seen; there is no evidence for ovarian
torsion.

No free fluid is seen within the pelvic cul-de-sac.
IMPRESSION: Single live intrauterine pregnancy noted, with a crown-rump length
of 1.0 cm, corresponding to a gestational age of 7 weeks 0 days.
This does not match the gestational age by LMP, and reflects a new
estimated date of delivery February 22, 2018.

## 2019-05-25 ENCOUNTER — Other Ambulatory Visit: Payer: Self-pay

## 2019-05-25 ENCOUNTER — Encounter: Payer: Self-pay | Admitting: Obstetrics and Gynecology

## 2019-05-25 ENCOUNTER — Ambulatory Visit (INDEPENDENT_AMBULATORY_CARE_PROVIDER_SITE_OTHER): Payer: Medicaid Other | Admitting: Obstetrics and Gynecology

## 2019-05-25 ENCOUNTER — Other Ambulatory Visit (HOSPITAL_COMMUNITY)
Admission: RE | Admit: 2019-05-25 | Discharge: 2019-05-25 | Disposition: A | Discharge status: Home / Self Care | Payer: Medicaid Other | Source: Ambulatory Visit | Attending: Obstetrics and Gynecology | Admitting: Obstetrics and Gynecology

## 2019-05-25 VITALS — BP 129/80 | HR 91 | Ht 66.0 in | Wt 169.9 lb

## 2019-05-25 DIAGNOSIS — Z124 Encounter for screening for malignant neoplasm of cervix: Secondary | ICD-10-CM | POA: Diagnosis not present

## 2019-05-25 DIAGNOSIS — Z3009 Encounter for other general counseling and advice on contraception: Secondary | ICD-10-CM | POA: Diagnosis not present

## 2019-05-25 DIAGNOSIS — N644 Mastodynia: Secondary | ICD-10-CM

## 2019-05-25 DIAGNOSIS — Z23 Encounter for immunization: Secondary | ICD-10-CM

## 2019-05-25 DIAGNOSIS — Z0000 Encounter for general adult medical examination without abnormal findings: Secondary | ICD-10-CM

## 2019-05-25 DIAGNOSIS — Z01419 Encounter for gynecological examination (general) (routine) without abnormal findings: Secondary | ICD-10-CM

## 2019-05-25 MED ORDER — XULANE 150-35 MCG/24HR TD PTWK: MEDICATED_PATCH | TRANSDERMAL | 3 refills | Status: DC

## 2019-05-25 NOTE — Progress Notes (Addendum)
GYNECOLOGY ANNUAL PHYSICAL EXAM PROGRESS NOTE  Subjective:    Yvonne Morrison is a 24 y.o. [id number] female who presents for an annual exam. The patient is sexually active.  The patient wears seatbelts: yes. The patient participates in regular exercise: yes. Has the patient ever been transfused or tattooed?: yes (tattoos). The patient reports that there is not domestic violence in her life.   The patient has the following complaints today 1. Breast pain for the past 2-3 months. Denies nipple discharge, masses.  Intermittent achy pain. Unsure if it is related to her menses.  2. Notes it is hard to lose weight. Has been exercising and trying to change her diet.  Thinks it may be due to the birth control patch. Desires to discuss other contraceptive methods, specifically tubal ligation.    Gynecologic History OB History    Gravida  3   Para  3   Term  1   Preterm  2   AB      Living  3     SAB      TAB      Ectopic      Multiple  0   Live Births  3        Obstetric Comments  History of cholestasis of pregnancy at 28 weeks History of renal lithiasis        Menarche age:  Patient's last menstrual period was 04/27/2019.    Contraception: Ortho-Evra patches weekly.  Desires to discuss BTL again.  History of STI's:  Last Pap: 06/2015. Results were: normal.  Denies h/o abnormal pap smears.   OB History  Gravida Para Term Preterm AB Living  3 3 1 2  0 3  SAB TAB Ectopic Multiple Live Births  0 0 0 0 3    # Outcome Date GA Lbr Len/2nd Weight Sex Delivery Anes PTL Lv  3 Term 02/01/18 [redacted]w[redacted]d / 00:05 6 lb 9.8 oz (3 kg) M Vag-Spont None  LIV     Name: Lipps,BOY Jaquisha     Apgar1: 8  Apgar5: 9  2 Preterm 01/10/16 [redacted]w[redacted]d 02:18 / 00:44 5 lb 3.8 oz (2.376 kg) M Vag-Spont None  LIV     Name: Mcinnis,PENDINGBABY     Apgar1: 8  Apgar5: 9  1 Preterm 03/07/14 [redacted]w[redacted]d  4 lb 1.9 oz (1.869 kg) F Vag-Spont   LIV    Obstetric Comments  History of cholestasis of pregnancy at 28 weeks    History of renal lithiasis    Past Medical History:  Diagnosis Date  . Anxiety   . Maternal renal lithiasis, history of    2015, during 1st pregnancy  . Rectal bleeding     Past Surgical History:  Procedure Laterality Date  . NO PAST SURGERIES      Family History  Problem Relation Age of Onset  . Diabetes Father   . Prostate cancer Father   . Cancer Neg Hx   . Heart disease Neg Hx     Social History   Socioeconomic History  . Marital status: Married    Spouse name: Not on file  . Number of children: Not on file  . Years of education: Not on file  . Highest education level: Not on file  Occupational History  . Not on file  Tobacco Use  . Smoking status: Never Smoker  . Smokeless tobacco: Never Used  Substance and Sexual Activity  . Alcohol use: Yes    Alcohol/week: 0.0 standard drinks  Comment: occass  . Drug use: No  . Sexual activity: Yes    Partners: Male    Birth control/protection: Patch  Other Topics Concern  . Not on file  Social History Narrative  . Not on file   Social Determinants of Health   Financial Resource Strain:   . Difficulty of Paying Living Expenses: Not on file  Food Insecurity:   . Worried About Charity fundraiser in the Last Year: Not on file  . Ran Out of Food in the Last Year: Not on file  Transportation Needs:   . Lack of Transportation (Medical): Not on file  . Lack of Transportation (Non-Medical): Not on file  Physical Activity:   . Days of Exercise per Week: Not on file  . Minutes of Exercise per Session: Not on file  Stress:   . Feeling of Stress : Not on file  Social Connections:   . Frequency of Communication with Friends and Family: Not on file  . Frequency of Social Gatherings with Friends and Family: Not on file  . Attends Religious Services: Not on file  . Active Member of Clubs or Organizations: Not on file  . Attends Archivist Meetings: Not on file  . Marital Status: Not on file  Intimate  Partner Violence:   . Fear of Current or Ex-Partner: Not on file  . Emotionally Abused: Not on file  . Physically Abused: Not on file  . Sexually Abused: Not on file    Current Outpatient Medications on File Prior to Visit  Medication Sig Dispense Refill  . Butalbital-APAP-Caffeine 50-325-40 MG capsule Take 1-2 capsules by mouth every 6 (six) hours as needed for headache. 30 capsule 3  . norelgestromin-ethinyl estradiol (XULANE) 150-35 MCG/24HR transdermal patch APPLY 1 PATCH TOPICALLY ONCE A WEEK 3 patch 4   No current facility-administered medications on file prior to visit.    No Known Allergies    Review of Systems Constitutional: negative for chills, fatigue, fevers and sweats Eyes: negative for irritation, redness and visual disturbance Ears, nose, mouth, throat, and face: negative for hearing loss, nasal congestion, snoring and tinnitus Respiratory: negative for asthma, cough, sputum Cardiovascular: negative for chest pain, dyspnea, exertional chest pressure/discomfort, irregular heart beat, palpitations and syncope Gastrointestinal: negative for abdominal pain, change in bowel habits, nausea and vomiting Genitourinary: negative for abnormal menstrual periods, genital lesions, sexual problems and vaginal discharge, dysuria and urinary incontinence Integument/breast: negative for breast lump, and nipple discharge. Positive for breast pain (see HPI).  Hematologic/lymphatic: negative for bleeding and easy bruising Musculoskeletal:negative for back pain and muscle weakness Neurological: negative for dizziness, headaches, vertigo and weakness Endocrine: negative for diabetic symptoms including polydipsia, polyuria and skin dryness Allergic/Immunologic: negative for hay fever and urticaria      Objective:  Blood pressure 129/80, pulse 91, height 5\' 6"  (1.676 m), weight 169 lb 14.4 oz (77.1 kg), last menstrual period 04/27/2019, currently breastfeeding. Body mass index is 27.42  kg/m.  General Appearance:    Alert, cooperative, no distress, appears stated age, overweight.  Head:    Normocephalic, without obvious abnormality, atraumatic  Eyes:    PERRL, conjunctiva/corneas clear, EOM's intact, both eyes  Ears:    Normal external ear canals, both ears  Nose:   Nares normal, septum midline, mucosa normal, no drainage or sinus tenderness  Throat:   Lips, mucosa, and tongue normal; teeth and gums normal  Neck:   Supple, symmetrical, trachea midline, no adenopathy; thyroid: no enlargement/tenderness/nodules; no carotid bruit or  JVD  Back:     Symmetric, no curvature, ROM normal, no CVA tenderness  Lungs:     Clear to auscultation bilaterally, respirations unlabored  Chest Wall:    No tenderness or deformity   Heart:    Regular rate and rhythm, S1 and S2 normal, no murmur, rub or gallop  Breast Exam:    No tenderness, masses, or nipple abnormality  Abdomen:     Soft, non-tender, bowel sounds active all four quadrants, no masses, no organomegaly.    Genitalia:    Pelvic:external genitalia normal, vagina without lesions, discharge, or tenderness, rectovaginal septum  normal. Cervix normal in appearance, no cervical motion tenderness, no adnexal masses or tenderness.  Uterus normal size, shape, mobile, regular contours, nontender.  Rectal:    Normal external sphincter.  No hemorrhoids appreciated. Internal exam not done.   Extremities:   Extremities normal, atraumatic, no cyanosis or edema  Pulses:   2+ and symmetric all extremities  Skin:   Skin color, texture, turgor normal, no rashes or lesions  Lymph nodes:   Cervical, supraclavicular, and axillary nodes normal  Neurologic:   CNII-XII intact, normal strength, sensation and reflexes throughout   .  Labs:  Lab Results  Component Value Date   WBC 10.6 (H) 02/02/2018   HGB 11.5 03/19/2018   HCT 35.2 03/19/2018   MCV 93.4 02/02/2018   PLT 176 02/02/2018    Lab Results  Component Value Date   CREATININE 0.71  06/27/2016   BUN 11 06/27/2016   NA 141 06/27/2016   K 4.0 06/27/2016   CL 101 06/27/2016   CO2 21 06/27/2016    Lab Results  Component Value Date   ALT 9 07/14/2017   AST 12 07/14/2017   ALKPHOS 68 07/14/2017   BILITOT 0.4 07/14/2017    No results found for: TSH   Assessment:   1. Encounter for well woman exam with routine gynecological exam   2. Encounter for counseling regarding contraception   3. Cervical cancer screening   4. Mastalgia in female   5. Flu vaccine need     Plan:     Blood tests: CBC with diff and Comprehensive metabolic panel. Breast self exam technique reviewed and patient encouraged to perform self-exam monthly. Contraception: currently on Xulane patch, however now desires tubal ligation. Had considered initially after the birth of her last child but opted out postpartum. Now desires to revisit conversation. Discussed all other methods of contraception. Discussed risks/benefits of procedure. Patient has Medicaid, tubal form signed today. Will have patient return in 30 days to schedule procedure after papers compliant. . Discussed healthy lifestyle modifications. Pap smear performed today. Flu vaccine given today.  Breast pain with unclear cause. Given reassurance that it was likely benign as no masses palpable on exam, bilateral nature of pain, and no personal or family history of breast disease. Discussed causes of mastalgia, encouraged home self-care measures (decreasing caffeine intake, NSAIDs, cool compresses, etc).   Follow up in 1 month for pre-op appointment, 1 year for annual exam.     [name], MD [facility]

## 2019-05-25 NOTE — Progress Notes (Signed)
Pt present for annual exam. Pt stated having breast pain in both breast off and on. Pt requested refill of birth control. Flu vaccine administered and documented.

## 2019-05-25 NOTE — Patient Instructions (Addendum)
Influenza (Flu) Vaccine (Inactivated or Recombinant): What You Need to Know 1. Why get vaccinated? Influenza vaccine can prevent influenza (flu). Flu is a contagious disease that spreads around the [place] every year, usually between October and May. Anyone can get the flu, but it is more dangerous for some people. Infants and young children, people 25 years of age and older, pregnant women, and people with certain health conditions or a weakened immune system are at greatest risk of flu complications. Pneumonia, bronchitis, sinus infections and ear infections are examples of flu-related complications. If you have a medical condition, such as heart disease, cancer or diabetes, flu can make it worse. Flu can cause fever and chills, sore throat, muscle aches, fatigue, cough, headache, and runny or stuffy nose. Some people may have vomiting and diarrhea, though this is more common in children than adults. Each year thousands of people in the [place] die from flu, and many more are hospitalized. Flu vaccine prevents millions of illnesses and flu-related visits to the doctor each year. 2. Influenza vaccine CDC recommends everyone 6 months of age and older get vaccinated every flu season. Children 6 months through 8 years of age may need 2 doses during a single flu season. Everyone else needs only 1 dose each flu season. It takes about 2 weeks for protection to develop after vaccination. There are many flu viruses, and they are always changing. Each year a new flu vaccine is made to protect against three or four viruses that are likely to cause disease in the upcoming flu season. Even when the vaccine doesn't exactly match these viruses, it may still provide some protection. Influenza vaccine does not cause flu. Influenza vaccine may be given at the same time as other vaccines. 3. Talk with your health care provider Tell your vaccine provider if the person getting the vaccine:  Has had an  allergic reaction after a previous dose of influenza vaccine, or has any severe, life-threatening allergies.  Has ever had Guillain-Barr Syndrome (also called GBS). In some cases, your health care provider may decide to postpone influenza vaccination to a future visit. People with minor illnesses, such as a cold, may be vaccinated. People who are moderately or severely ill should usually wait until they recover before getting influenza vaccine. Your health care provider can give you more information. 4. Risks of a vaccine reaction  Soreness, redness, and swelling where shot is given, fever, muscle aches, and headache can happen after influenza vaccine.  There may be a very small increased risk of Guillain-Barr Syndrome (GBS) after inactivated influenza vaccine (the flu shot). Young children who get the flu shot along with pneumococcal vaccine (PCV13), and/or DTaP vaccine at the same time might be slightly more likely to have a seizure caused by fever. Tell your health care provider if a child who is getting flu vaccine has ever had a seizure. People sometimes faint after medical procedures, including vaccination. Tell your provider if you feel dizzy or have vision changes or ringing in the ears. As with any medicine, there is a very remote chance of a vaccine causing a severe allergic reaction, other serious injury, or death. 5. What if there is a serious problem? An allergic reaction could occur after the vaccinated person leaves the clinic. If you see signs of a severe allergic reaction (hives, swelling of the face and throat, difficulty breathing, a fast heartbeat, dizziness, or weakness), call 9-1-1 and get the person to the nearest hospital. For other signs that   concern you, call your health care provider. Adverse reactions should be reported to the Vaccine Adverse Event Reporting System (VAERS). Your health care provider will usually file this report, or you can do it yourself. Visit the  VAERS website at www.vaers.hhs.gov or call 1-800-822-7967.VAERS is only for reporting reactions, and VAERS staff do not give medical advice. 6. The National Vaccine Injury Compensation Program The National Vaccine Injury Compensation Program (VICP) is a federal program that was created to compensate people who may have been injured by certain vaccines. Visit the VICP website at www.hrsa.gov/vaccinecompensation or call 1-800-338-2382 to learn about the program and about filing a claim. There is a time limit to file a claim for compensation. 7. How can I learn more?  Ask your healthcare provider.  Call your local or state health department.  Contact the Centers for Disease Control and Prevention (CDC): ? Call 1-800-232-4636 (1-800-CDC-INFO) or ? Visit CDC's www.cdc.gov/flu Vaccine Information Statement (Interim) Inactivated Influenza Vaccine (11/05/2017) This information is not intended to replace advice given to you by your health care provider. Make sure you discuss any questions you have with your health care provider. Document Revised: 06/29/2018 Document Reviewed: 11/09/2017 Elsevier Patient Education  2020 Elsevier Inc.   Preventive Care 21-39 Years Old, Female Preventive care refers to visits with your health care provider and lifestyle choices that can promote health and wellness. This includes:  A yearly physical exam. This may also be called an annual well check.  Regular dental visits and eye exams.  Immunizations.  Screening for certain conditions.  Healthy lifestyle choices, such as eating a healthy diet, getting regular exercise, not using drugs or products that contain nicotine and tobacco, and limiting alcohol use. What can I expect for my preventive care visit? Physical exam Your health care provider will check your:  Height and weight. This may be used to calculate body mass index (BMI), which tells if you are at a healthy weight.  Heart rate and blood  pressure.  Skin for abnormal spots. Counseling Your health care provider may ask you questions about your:  Alcohol, tobacco, and drug use.  Emotional well-being.  Home and relationship well-being.  Sexual activity.  Eating habits.  Work and work environment.  Method of birth control.  Menstrual cycle.  Pregnancy history. What immunizations do I need?  Influenza (flu) vaccine  This is recommended every year. Tetanus, diphtheria, and pertussis (Tdap) vaccine  You may need a Td booster every 10 years. Varicella (chickenpox) vaccine  You may need this if you have not been vaccinated. Human papillomavirus (HPV) vaccine  If recommended by your health care provider, you may need three doses over 6 months. Measles, mumps, and rubella (MMR) vaccine  You may need at least one dose of MMR. You may also need a second dose. Meningococcal conjugate (MenACWY) vaccine  One dose is recommended if you are age 19-21 years and a first-year college student living in a residence hall, or if you have one of several medical conditions. You may also need additional booster doses. Pneumococcal conjugate (PCV13) vaccine  You may need this if you have certain conditions and were not previously vaccinated. Pneumococcal polysaccharide (PPSV23) vaccine  You may need one or two doses if you smoke cigarettes or if you have certain conditions. Hepatitis A vaccine  You may need this if you have certain conditions or if you travel or work in places where you may be exposed to hepatitis A. Hepatitis B vaccine  You may need this if   certain conditions or if you travel or work in places where you may be exposed to hepatitis B. Haemophilus influenzae type b (Hib) vaccine  You may need this if you have certain conditions. You may receive vaccines as individual doses or as more than one vaccine together in one shot (combination vaccines). Talk with your health care provider about the risks  and benefits of combination vaccines. What tests do I need?  Blood tests  Lipid and cholesterol levels. These may be checked every 5 years starting at age 66.  Hepatitis C test.  Hepatitis B test. Screening  Diabetes screening. This is done by checking your blood sugar (glucose) after you have not eaten for a while (fasting).  Sexually transmitted disease (STD) testing.  BRCA-related cancer screening. This may be done if you have a family history of breast, ovarian, tubal, or peritoneal cancers.  Pelvic exam and Pap test. This may be done every 3 years starting at age 41. Starting at age 36, this may be done every 5 years if you have a Pap test in combination with an HPV test. Talk with your health care provider about your test results, treatment options, and if necessary, the need for more tests. Follow these instructions at home: Eating and drinking   Eat a diet that includes fresh fruits and vegetables, whole grains, lean protein, and low-fat dairy.  Take vitamin and mineral supplements as recommended by your health care provider.  Do not drink alcohol if: ? Your health care provider tells you not to drink. ? You are pregnant, may be pregnant, or are planning to become pregnant.  If you drink alcohol: ? Limit how much you have to 0-1 drink a day. ? Be aware of how much alcohol is in your drink. In the [place] one drink equals one 12 oz bottle of beer (355 mL), one 5 oz glass of wine (148 mL), or one 1 oz glass of hard liquor (44 mL). Lifestyle  Take daily care of your teeth and gums.  Stay active. Exercise for at least 30 minutes on 5 or more days each week.  Do not use any products that contain nicotine or tobacco, such as cigarettes, e-cigarettes, and chewing tobacco. If you need help quitting, ask your health care provider.  If you are sexually active, practice safe sex. Use a condom or other form of birth control (contraception) in order to prevent pregnancy and STIs  (sexually transmitted infections). If you plan to become pregnant, see your health care provider for a preconception visit. What's next?  Visit your health care provider once a year for a well check visit.  Ask your health care provider how often you should have your eyes and teeth checked.  Stay up to date on all vaccines. This information is not intended to replace advice given to you by your health care provider. Make sure you discuss any questions you have with your health care provider. Document Revised: 11/19/2017 Document Reviewed: 11/19/2017 Elsevier Patient Education  2020 [place] Breast self-awareness is knowing how your breasts look and feel. Doing breast self-awareness is important. It allows you to catch a breast problem early while it is still small and can be treated. All women should do breast self-awareness, including women who have had breast implants. Tell your doctor if you notice a change in your breasts. What you need:  A mirror.  A well-lit room. How to do a breast self-exam A breast self-exam is one way  to learn what is normal for your breasts and to check for changes. To do a breast self-exam: Look for changes  1. Take off all the clothes above your waist. 2. Stand in front of a mirror in a room with good lighting. 3. Put your hands on your hips. 4. Push your hands down. 5. Look at your breasts and nipples in the mirror to see if one breast or nipple looks different from the other. Check to see if: ? The shape of one breast is different. ? The size of one breast is different. ? There are wrinkles, dips, and bumps in one breast and not the other. 6. Look at each breast for changes in the skin, such as: ? Redness. ? Scaly areas. 7. Look for changes in your nipples, such as: ? Liquid around the nipples. ? Bleeding. ? Dimpling. ? Redness. ? A change in where the nipples are. Feel for changes  1. Lie on your back on the  floor. 2. Feel each breast. To do this, follow these steps: ? Pick a breast to feel. ? Put the arm closest to that breast above your head. ? Use your other arm to feel the nipple area of your breast. Feel the area with the pads of your three middle fingers by making small circles with your fingers. For the first circle, press lightly. For the second circle, press harder. For the third circle, press even harder. ? Keep making circles with your fingers at the different pressures as you move down your breast. Stop when you feel your ribs. ? Move your fingers a little toward the center of your body. ? Start making circles with your fingers again, this time going up until you reach your collarbone. ? Keep making up-and-down circles until you reach your armpit. Remember to keep using the three pressures. ? Feel the other breast in the same way. 3. Sit or stand in the tub or shower. 4. With soapy water on your skin, feel each breast the same way you did in step 2 when you were lying on the floor. Write down what you find Writing down what you find can help you remember what to tell your doctor. Write down:  What is normal for each breast.  Any changes you find in each breast, including: ? The kind of changes you find. ? Whether you have pain. ? Size and location of any lumps.  When you last had your menstrual period. General tips  Check your breasts every month.  If you are breastfeeding, the best time to check your breasts is after you feed your baby or after you use a breast pump.  If you get menstrual periods, the best time to check your breasts is 5-7 days after your menstrual period is over.  With time, you will become comfortable with the self-exam, and you will begin to know if there are changes in your breasts. Contact a doctor if you:  See a change in the shape or size of your breasts or nipples.  See a change in the skin of your breast or nipples, such as red or scaly  skin.  Have fluid coming from your nipples that is not normal.  Find a lump or thick area that was not there before.  Have pain in your breasts.  Have any concerns about your breast health. Summary  Breast self-awareness includes looking for changes in your breasts, as well as feeling for changes within your breasts.  Breast self-awareness should  be done in front of a mirror in a well-lit room.  You should check your breasts every month. If you get menstrual periods, the best time to check your breasts is 5-7 days after your menstrual period is over.  Let your doctor know of any changes you see in your breasts, including changes in size, changes on the skin, pain or tenderness, or fluid from your nipples that is not normal. This information is not intended to replace advice given to you by your health care provider. Make sure you discuss any questions you have with your health care provider. Document Revised: 10/27/2017 Document Reviewed: 10/27/2017 Elsevier Patient Education  [place].     Breast Tenderness Breast tenderness is a common problem for women of all ages, but may also occur in men. Breast tenderness may range from mild discomfort to severe pain. In women, the pain usually comes and goes with the menstrual cycle, but it can also be constant. Breast tenderness has many possible causes, including hormone changes, infections, and taking certain medicines. You may have tests, such as a mammogram or an ultrasound, to check for any unusual findings. Having breast tenderness usually does not mean that you have breast cancer. Follow these instructions at home: Managing pain and discomfort   If directed, put ice to the painful area. To do this: ? Put ice in a plastic bag. ? Place a towel between your skin and the bag. ? Leave the ice on for 20 minutes, 2-3 times a day.  Wear a supportive bra, especially during exercise. You may also want to wear a supportive bra  while sleeping if your breasts are very tender. Medicines  Take over-the-counter and prescription medicines only as told by your health care provider. If the cause of your pain is infection, you may be prescribed an antibiotic medicine.  If you were prescribed an antibiotic, take it as told by your health care provider. Do not stop taking the antibiotic even if you start to feel better. Eating and drinking  Your health care provider may recommend that you lessen the amount of fat in your diet. You can do this by: ? Limiting fried foods. ? Cooking foods using methods such as baking, boiling, grilling, and broiling.  Decrease the amount of caffeine in your diet. Instead, drink more water and choose caffeine-free drinks. General instructions   Keep a log of the days and times when your breasts are most tender.  Ask your health care provider how to do breast exams at home. This will help you notice if you have an unusual growth or lump.  Keep all follow-up visits as told by your health care provider. This is important. Contact a health care provider if:  Any part of your breast is hard, red, and hot to the touch. This may be a sign of infection.  You are a woman and: ? Not breastfeeding and you have fluid, especially blood or pus, coming out of your nipples. ? Have a new or painful lump in your breast that remains after your menstrual period ends.  You have a fever.  Your pain does not improve or it gets worse.  Your pain is interfering with your daily activities. Summary  Breast tenderness may range from mild discomfort to severe pain.  Breast tenderness has many possible causes, including hormone changes, infections, and taking certain medicines.  It can be treated with ice, wearing a supportive bra, and medicines.  Make changes to your diet if told  to by your health care provider. This information is not intended to replace advice given to you by your health care provider.  Make sure you discuss any questions you have with your health care provider. Document Revised: 08/02/2018 Document Reviewed: 08/02/2018 Elsevier Patient Education  [place].

## 2019-05-31 LAB — CYTOLOGY - PAP
Chlamydia: NEGATIVE
Comment: NEGATIVE
Comment: NORMAL
Diagnosis: NEGATIVE
Neisseria Gonorrhea: NEGATIVE

## 2019-06-02 ENCOUNTER — Other Ambulatory Visit: Payer: Self-pay

## 2019-06-02 ENCOUNTER — Other Ambulatory Visit: Payer: Medicaid Other

## 2019-06-03 LAB — COMPREHENSIVE METABOLIC PANEL
ALT: 8 IU/L (ref 0–32)
AST: 16 IU/L (ref 0–40)
Albumin/Globulin Ratio: 1.2 (ref 1.2–2.2)
Albumin: 4.1 g/dL (ref 3.9–5.0)
Alkaline Phosphatase: 94 IU/L (ref 39–117)
BUN/Creatinine Ratio: 15 (ref 9–23)
BUN: 10 mg/dL (ref 6–20)
Bilirubin Total: 0.2 mg/dL (ref 0.0–1.2)
CO2: 25 mmol/L (ref 20–29)
Calcium: 9.3 mg/dL (ref 8.7–10.2)
Chloride: 101 mmol/L (ref 96–106)
Creatinine, Ser: 0.66 mg/dL (ref 0.57–1.00)
GFR calc Af Amer: 143 mL/min/{1.73_m2} (ref >59)
GFR calc non Af Amer: 124 mL/min/{1.73_m2} (ref >59)
Globulin, Total: 3.4 g/dL (ref 1.5–4.5)
Glucose: 79 mg/dL (ref 65–99)
Potassium: 4.4 mmol/L (ref 3.5–5.2)
Sodium: 139 mmol/L (ref 134–144)
Total Protein: 7.5 g/dL (ref 6.0–8.5)

## 2019-06-03 LAB — CBC
Hematocrit: 34.3 % (ref 34.0–46.6)
Hemoglobin: 11.4 g/dL (ref 11.1–15.9)
MCH: 27.2 pg (ref 26.6–33.0)
MCHC: 33.2 g/dL (ref 31.5–35.7)
MCV: 82 fL (ref 79–97)
Platelets: 327 10*3/uL (ref 150–450)
RBC: 4.19 x10E6/uL (ref 3.77–5.28)
RDW: 14.4 % (ref 11.7–15.4)
WBC: 7.9 10*3/uL (ref 3.4–10.8)

## 2019-06-22 ENCOUNTER — Encounter: Payer: Self-pay | Admitting: Obstetrics and Gynecology

## 2019-06-22 ENCOUNTER — Ambulatory Visit (INDEPENDENT_AMBULATORY_CARE_PROVIDER_SITE_OTHER): Payer: Medicaid Other | Admitting: Obstetrics and Gynecology

## 2019-06-22 ENCOUNTER — Other Ambulatory Visit: Payer: Self-pay

## 2019-06-22 VITALS — BP 105/68 | HR 89 | Ht 66.0 in | Wt 172.2 lb

## 2019-06-22 DIAGNOSIS — Z01818 Encounter for other preprocedural examination: Secondary | ICD-10-CM

## 2019-06-22 NOTE — Patient Instructions (Signed)
You are scheduled for surgery on 06/27/2019.  Nothing to eat after midnight on day prior to surgery.  Do not take any medications unless recommended by your provider on day prior to surgery.  Do not take NSAIDs (Motrin, Aleve) or aspirin 7 days prior to surgery.  You may take Tylenol products for minor aches and pains.  You will receive a prescription for pain medications post-operatively.  You will be contacted by phone within approximately 1 week prior to surgery for pre-operative appointment.  Please call the office if you have any questions regarding your upcoming surgery.

## 2019-06-22 NOTE — Progress Notes (Signed)
GYNECOLOGY PREOPERATIVE HISTORY AND PHYSICAL  Subjective:  Yvonne Morrison is a 24 y.o. [phone] here for pre-operative examination.  She desires surgical management bilateral tubal ligation, desires permanent sterilization due to multiparity.   No significant preoperative concerns.  Denies complaints today.   Proposed surgery: Bilateral Tubal Ligation (Filshie Clips)   Pertinent Gynecological History: Menses: regular every month without intermenstrual spotting Contraception: Xulane patches weekly Last pap: normal Date: 05/25/2019   Past Medical History:  Diagnosis Date  . Anxiety   . History of cholestasis during pregnancy 12/02/2016  . Maternal renal lithiasis, history of    2015, during 1st pregnancy  . Rectal bleeding     Past Surgical History:  Procedure Laterality Date  . NO PAST SURGERIES      OB History  Gravida Para Term Preterm AB Living  3 3 1 2   3   SAB TAB Ectopic Multiple Live Births        0 3    # Outcome Date GA Lbr Len/2nd Weight Sex Delivery Anes PTL Lv  3 Term 02/01/18 [redacted]w[redacted]d / 00:05 6 lb 9.8 oz (3 kg) M Vag-Spont None  LIV  2 Preterm 01/10/16 [redacted]w[redacted]d 02:18 / 00:44 5 lb 3.8 oz (2.376 kg) M Vag-Spont None  LIV  1 Preterm 03/07/14 [redacted]w[redacted]d  4 lb 1.9 oz (1.869 kg) F Vag-Spont   LIV    Obstetric Comments  History of cholestasis of pregnancy at 28 weeks  History of renal lithiasis  Patient denies any other pertinent gynecologic issues.  Family History  Problem Relation Age of Onset  . Diabetes Father   . Prostate cancer Father   . Cancer Neg Hx   . Heart disease Neg Hx     Social History   Socioeconomic History  . Marital status: Married    Spouse name: Not on file  . Number of children: Not on file  . Years of education: Not on file  . Highest education level: Not on file  Occupational History  . Not on file  Tobacco Use  . Smoking status: Never Smoker  . Smokeless tobacco: Never Used  Substance and Sexual Activity  . Alcohol use: Yes   Alcohol/week: 0.0 standard drinks    Comment: occass  . Drug use: No  . Sexual activity: Yes    Partners: Male    Birth control/protection: Patch  Other Topics Concern  . Not on file  Social History Narrative  . Not on file   Social Determinants of Health   Financial Resource Strain:   . Difficulty of Paying Living Expenses:   Food Insecurity:   . Worried About Charity fundraiser in the Last Year:   . Arboriculturist in the Last Year:   Transportation Needs:   . Film/video editor (Medical):   Marland Kitchen Lack of Transportation (Non-Medical):   Physical Activity:   . Days of Exercise per Week:   . Minutes of Exercise per Session:   Stress:   . Feeling of Stress :   Social Connections:   . Frequency of Communication with Friends and Family:   . Frequency of Social Gatherings with Friends and Family:   . Attends Religious Services:   . Active Member of Clubs or Organizations:   . Attends Archivist Meetings:   Marland Kitchen Marital Status:   Intimate Partner Violence:   . Fear of Current or Ex-Partner:   . Emotionally Abused:   Marland Kitchen Physically Abused:   . Sexually  Abused:     Current Outpatient Medications on File Prior to Visit  Medication Sig Dispense Refill  . Butalbital-APAP-Caffeine 50-325-40 MG capsule Take 1-2 capsules by mouth every 6 (six) hours as needed for headache. 30 capsule 3  . norelgestromin-ethinyl estradiol (XULANE) 150-35 MCG/24HR transdermal patch APPLY 1 PATCH TOPICALLY ONCE A WEEK 3 patch 3   No current facility-administered medications on file prior to visit.    No Known Allergies   Review of Systems Constitutional: No recent fever/chills/sweats Respiratory: No recent cough/bronchitis Cardiovascular: No chest pain Gastrointestinal: No recent nausea/vomiting/diarrhea Genitourinary: No UTI symptoms Hematologic/lymphatic:No history of coagulopathy or recent blood thinner use    Objective:   Blood pressure 105/68, pulse 89, height 5\' 6"  (1.676 m),  weight 172 lb 3.2 oz (78.1 kg), last menstrual period 05/22/2019, currently breastfeeding. CONSTITUTIONAL: Well-developed, well-nourished female in no acute distress.  HENT:  Normocephalic, atraumatic, External right and left ear normal. Oropharynx is clear and moist EYES: Conjunctivae and EOM are normal. Pupils are equal, round, and reactive to light. No scleral icterus.  NECK: Normal range of motion, supple, no masses SKIN: Skin is warm and dry. No rash noted. Not diaphoretic. No erythema. No pallor. NEUROLOGIC: Alert and oriented to person, place, and time. Normal reflexes, muscle tone coordination. No cranial nerve deficit noted. PSYCHIATRIC: Normal mood and affect. Normal behavior. Normal judgment and thought content. CARDIOVASCULAR: Normal heart rate noted, regular rhythm RESPIRATORY: Effort and breath sounds normal, no problems with respiration noted ABDOMEN: Soft, nontender, nondistended. PELVIC: Deferred MUSCULOSKELETAL: Normal range of motion. No edema and no tenderness. 2+ distal pulses.    Labs: No results found for this or any previous visit (from the past 336 hour(s)).   Imaging Studies: No results found.  Assessment:    Preoperative evaluation for tubal ligation  Plan:   Patient desires permanent sterilization.  Other reversible forms of contraception were discussed with patient; she declines all other modalities. Risks of procedure discussed with patient including but not limited to: risk of regret, permanence of method, bleeding, infection, injury to surrounding organs and need for additional procedures.  Failure risk of about 1% with increased risk of ectopic gestation if pregnancy occurs was also discussed with patient.  Also discussed possibility of post-tubal pain syndrome. Patient verbalized understanding of these risks and wants to proceed with sterilization.  Written informed consent obtained. Medicaid sterilization consent form is now compliant.  Will plan for  surgery 06/27/2019.  Preop testing ordered. Instructions reviewed, including NPO after midnight.   [name], MD [facility]

## 2019-06-22 NOTE — Progress Notes (Signed)
Pt present for pre-op visit to discuss tubal ligation. Pt stated that she was doing well.

## 2019-06-23 ENCOUNTER — Other Ambulatory Visit
Admission: RE | Admit: 2019-06-23 | Discharge: 2019-06-23 | Disposition: A | Discharge status: Home / Self Care | Payer: Medicaid Other | Source: Ambulatory Visit | Attending: Obstetrics and Gynecology | Admitting: Obstetrics and Gynecology

## 2019-06-23 DIAGNOSIS — Z01812 Encounter for preprocedural laboratory examination: Secondary | ICD-10-CM | POA: Insufficient documentation

## 2019-06-23 DIAGNOSIS — Z20822 Contact with and (suspected) exposure to covid-19: Secondary | ICD-10-CM | POA: Diagnosis not present

## 2019-06-24 LAB — SARS CORONAVIRUS 2 (TAT 6-24 HRS): SARS Coronavirus 2: NEGATIVE

## 2019-06-27 ENCOUNTER — Ambulatory Visit: Payer: Medicaid Other | Admitting: Anesthesiology

## 2019-06-27 ENCOUNTER — Encounter: Payer: Self-pay | Admitting: Obstetrics and Gynecology

## 2019-06-27 ENCOUNTER — Telehealth: Payer: Self-pay | Admitting: Obstetrics and Gynecology

## 2019-06-27 ENCOUNTER — Encounter
Admission: RE | Disposition: A | Discharge status: Home / Self Care | Payer: Self-pay | Source: Home / Self Care | Attending: Obstetrics and Gynecology

## 2019-06-27 ENCOUNTER — Ambulatory Visit
Admission: RE | Admit: 2019-06-27 | Discharge: 2019-06-27 | Disposition: A | Discharge status: Home / Self Care | Payer: Medicaid Other | Attending: Obstetrics and Gynecology | Admitting: Obstetrics and Gynecology

## 2019-06-27 ENCOUNTER — Other Ambulatory Visit: Payer: Self-pay

## 2019-06-27 DIAGNOSIS — Z302 Encounter for sterilization: Secondary | ICD-10-CM | POA: Insufficient documentation

## 2019-06-27 HISTORY — PX: LAPAROSCOPIC TUBAL LIGATION: SHX1937

## 2019-06-27 LAB — POCT PREGNANCY, URINE: Preg Test, Ur: NEGATIVE

## 2019-06-27 LAB — CBC
HCT: 33 % — ABNORMAL LOW (ref 36.0–46.0)
Hemoglobin: 10.6 g/dL — ABNORMAL LOW (ref 12.0–15.0)
MCH: 26.6 pg (ref 26.0–34.0)
MCHC: 32.1 g/dL (ref 30.0–36.0)
MCV: 82.7 fL (ref 80.0–100.0)
Platelets: 281 10*3/uL (ref 150–400)
RBC: 3.99 MIL/uL (ref 3.87–5.11)
RDW: 14 % (ref 11.5–15.5)
WBC: 6.4 10*3/uL (ref 4.0–10.5)
nRBC: 0 % (ref 0.0–0.2)

## 2019-06-27 SURGERY — LIGATION, FALLOPIAN TUBE, LAPAROSCOPIC
Anesthesia: General | Laterality: Bilateral

## 2019-06-27 MED ORDER — IBUPROFEN 800 MG PO TABS: 800.0000 mg | ORAL_TABLET | Freq: Three times a day (TID) | ORAL | 1 refills | Status: AC | PRN

## 2019-06-27 MED ORDER — LACTATED RINGERS IV SOLN: INTRAVENOUS | Status: DC

## 2019-06-27 MED ORDER — CELECOXIB 200 MG PO CAPS
ORAL_CAPSULE | ORAL | Status: AC
  Administered 2019-06-27: 400 mg via ORAL
  Filled 2019-06-27: qty 2

## 2019-06-27 MED ORDER — FENTANYL CITRATE (PF) 100 MCG/2ML IJ SOLN
INTRAMUSCULAR | Status: AC
  Filled 2019-06-27: qty 2

## 2019-06-27 MED ORDER — FENTANYL CITRATE (PF) 100 MCG/2ML IJ SOLN
25.0000 ug | INTRAMUSCULAR | Status: DC | PRN
  Administered 2019-06-27 (×3): 25 ug via INTRAVENOUS

## 2019-06-27 MED ORDER — OXYCODONE-ACETAMINOPHEN 5-325 MG PO TABS
ORAL_TABLET | ORAL | Status: AC
  Filled 2019-06-27: qty 1

## 2019-06-27 MED ORDER — SEVOFLURANE IN SOLN
RESPIRATORY_TRACT | Status: AC
  Filled 2019-06-27: qty 250

## 2019-06-27 MED ORDER — OXYCODONE-ACETAMINOPHEN 5-325 MG PO TABS: 1.0000 | ORAL_TABLET | Freq: Four times a day (QID) | ORAL | 0 refills | Status: DC | PRN

## 2019-06-27 MED ORDER — LIDOCAINE HCL (CARDIAC) PF 100 MG/5ML IV SOSY
PREFILLED_SYRINGE | INTRAVENOUS | Status: DC | PRN
  Administered 2019-06-27: 100 mg via INTRAVENOUS

## 2019-06-27 MED ORDER — OXYCODONE-ACETAMINOPHEN 5-325 MG PO TABS
1.0000 | ORAL_TABLET | Freq: Four times a day (QID) | ORAL | Status: DC | PRN
  Administered 2019-06-27: 1 via ORAL

## 2019-06-27 MED ORDER — PHENYLEPHRINE HCL (PRESSORS) 10 MG/ML IV SOLN
INTRAVENOUS | Status: DC | PRN
  Administered 2019-06-27 (×2): 100 ug via INTRAVENOUS

## 2019-06-27 MED ORDER — PROPOFOL 10 MG/ML IV BOLUS
INTRAVENOUS | Status: AC
  Filled 2019-06-27: qty 60

## 2019-06-27 MED ORDER — FAMOTIDINE 20 MG PO TABS
ORAL_TABLET | ORAL | Status: AC
  Administered 2019-06-27: 20 mg via ORAL
  Filled 2019-06-27: qty 1

## 2019-06-27 MED ORDER — MIDAZOLAM HCL 2 MG/2ML IJ SOLN
INTRAMUSCULAR | Status: AC
  Filled 2019-06-27: qty 2

## 2019-06-27 MED ORDER — PROPOFOL 10 MG/ML IV BOLUS
INTRAVENOUS | Status: DC | PRN
  Administered 2019-06-27: 25 mg via INTRAVENOUS
  Administered 2019-06-27: 150 mg via INTRAVENOUS
  Administered 2019-06-27: 25 mg via INTRAVENOUS

## 2019-06-27 MED ORDER — ACETAMINOPHEN 500 MG PO TABS: 1000.0000 mg | ORAL_TABLET | ORAL | Status: AC

## 2019-06-27 MED ORDER — FENTANYL CITRATE (PF) 100 MCG/2ML IJ SOLN
INTRAMUSCULAR | Status: AC
  Administered 2019-06-27: 25 ug via INTRAVENOUS
  Filled 2019-06-27: qty 2

## 2019-06-27 MED ORDER — SUGAMMADEX SODIUM 200 MG/2ML IV SOLN
INTRAVENOUS | Status: DC | PRN
  Administered 2019-06-27: 200 mg via INTRAVENOUS

## 2019-06-27 MED ORDER — FENTANYL CITRATE (PF) 100 MCG/2ML IJ SOLN
INTRAMUSCULAR | Status: DC | PRN
  Administered 2019-06-27 (×2): 50 ug via INTRAVENOUS

## 2019-06-27 MED ORDER — PROMETHAZINE HCL 25 MG/ML IJ SOLN: 6.2500 mg | INTRAMUSCULAR | Status: DC | PRN

## 2019-06-27 MED ORDER — DEXAMETHASONE SODIUM PHOSPHATE 10 MG/ML IJ SOLN
INTRAMUSCULAR | Status: DC | PRN
  Administered 2019-06-27: 10 mg via INTRAVENOUS

## 2019-06-27 MED ORDER — BUPIVACAINE HCL 0.5 % IJ SOLN
INTRAMUSCULAR | Status: DC | PRN
  Administered 2019-06-27: 16 mL

## 2019-06-27 MED ORDER — ONDANSETRON HCL 4 MG/2ML IJ SOLN
INTRAMUSCULAR | Status: DC | PRN
  Administered 2019-06-27: 4 mg via INTRAVENOUS

## 2019-06-27 MED ORDER — ACETAMINOPHEN 500 MG PO TABS
ORAL_TABLET | ORAL | Status: AC
  Administered 2019-06-27: 1000 mg via ORAL
  Filled 2019-06-27: qty 2

## 2019-06-27 MED ORDER — MIDAZOLAM HCL 2 MG/2ML IJ SOLN
INTRAMUSCULAR | Status: DC | PRN
  Administered 2019-06-27: 2 mg via INTRAVENOUS

## 2019-06-27 MED ORDER — CELECOXIB 200 MG PO CAPS: 400.0000 mg | ORAL_CAPSULE | ORAL | Status: AC

## 2019-06-27 MED ORDER — ROCURONIUM BROMIDE 100 MG/10ML IV SOLN
INTRAVENOUS | Status: DC | PRN
  Administered 2019-06-27: 50 mg via INTRAVENOUS

## 2019-06-27 MED ORDER — FAMOTIDINE 20 MG PO TABS: 20.0000 mg | ORAL_TABLET | Freq: Once | ORAL | Status: AC

## 2019-06-27 MED ORDER — DOCUSATE SODIUM 100 MG PO CAPS: 100.0000 mg | ORAL_CAPSULE | Freq: Two times a day (BID) | ORAL | 1 refills | Status: AC | PRN

## 2019-06-27 MED ORDER — BUPIVACAINE HCL (PF) 0.5 % IJ SOLN
INTRAMUSCULAR | Status: AC
  Filled 2019-06-27: qty 30

## 2019-06-27 SURGICAL SUPPLY — 27 items
BLADE SURG SZ11 CARB STEEL (BLADE) ×3 IMPLANT
CATH ROBINSON RED A/P 16FR (CATHETERS) ×3 IMPLANT
CHLORAPREP W/TINT 26 (MISCELLANEOUS) ×3 IMPLANT
CLIP FILSHIE TUBAL LIGA STRL (Clip) ×6 IMPLANT
COVER WAND RF STERILE (DRAPES) ×3 IMPLANT
DERMABOND ADVANCED (GAUZE/BANDAGES/DRESSINGS) ×2
DERMABOND ADVANCED .7 DNX12 (GAUZE/BANDAGES/DRESSINGS) ×1 IMPLANT
GLOVE BIO SURGEON STRL SZ 6.5 (GLOVE) ×2 IMPLANT
GLOVE BIO SURGEONS STRL SZ 6.5 (GLOVE) ×1
GLOVE INDICATOR 7.0 STRL GRN (GLOVE) ×3 IMPLANT
GOWN STRL REUS W/ TWL LRG LVL3 (GOWN DISPOSABLE) ×2 IMPLANT
GOWN STRL REUS W/TWL LRG LVL3 (GOWN DISPOSABLE) ×4
KIT PINK PAD W/HEAD ARE REST (MISCELLANEOUS) ×3
KIT PINK PAD W/HEAD ARM REST (MISCELLANEOUS) ×1 IMPLANT
KIT TURNOVER CYSTO (KITS) ×3 IMPLANT
LABEL OR SOLS (LABEL) ×3 IMPLANT
NS IRRIG 500ML POUR BTL (IV SOLUTION) ×3 IMPLANT
PACK GYN LAPAROSCOPIC (MISCELLANEOUS) ×3 IMPLANT
PAD OB MATERNITY 4.3X12.25 (PERSONAL CARE ITEMS) ×3 IMPLANT
PAD PREP 24X41 OB/GYN DISP (PERSONAL CARE ITEMS) ×3 IMPLANT
SET TUBE SMOKE EVAC HIGH FLOW (TUBING) ×3 IMPLANT
SHEARS ENDO 5MM LNG  ASK BEFOR (MISCELLANEOUS)
SHEARS ENDO 5MM LNG ASK BEFOR (MISCELLANEOUS) IMPLANT
SUT VIC AB 0 CT2 27 (SUTURE) ×3 IMPLANT
SUT VIC AB 4-0 SH 27 (SUTURE) ×2
SUT VIC AB 4-0 SH 27XANBCTRL (SUTURE) ×1 IMPLANT
TROCAR ENDO BLADELESS 11MM (ENDOMECHANICALS) ×3 IMPLANT

## 2019-06-27 NOTE — Anesthesia Procedure Notes (Signed)
Procedure Name: Intubation Date/Time: 06/27/2019 7:41 AM Performed by: Leander Rams, CRNA Pre-anesthesia Checklist: Patient identified, Emergency Drugs available, Suction available, Patient being monitored and Timeout performed Patient Re-evaluated:Patient Re-evaluated prior to induction Oxygen Delivery Method: Circle system utilized Preoxygenation: Pre-oxygenation with 100% oxygen Induction Type: IV induction Ventilation: Mask ventilation without difficulty Laryngoscope Size: Mac and 3 Grade View: Grade I Tube type: Oral Tube size: 6.5 mm Number of attempts: 1 Airway Equipment and Method: Stylet Placement Confirmation: ETT inserted through vocal cords under direct vision,  positive ETCO2,  CO2 detector and breath sounds checked- equal and bilateral Secured at: 21 cm Tube secured with: Tape Dental Injury: Teeth and Oropharynx as per pre-operative assessment

## 2019-06-27 NOTE — Discharge Instructions (Signed)
AMBULATORY SURGERY  DISCHARGE INSTRUCTIONS   1) The drugs that you were given will stay in your system until tomorrow so for the next 24 hours you should not:  A) Drive an automobile B) Make any legal decisions C) Drink any alcoholic beverage   2) You may resume regular meals tomorrow.  Today it is better to start with liquids and gradually work up to solid foods.  You may eat anything you prefer, but it is better to start with liquids, then soup and crackers, and gradually work up to solid foods.   3) Please notify your doctor immediately if you have any unusual bleeding, trouble breathing, redness and pain at the surgery site, drainage, fever, or pain not relieved by medication.    4) Additional Instructions:        Please contact your physician with any problems or Same Day Surgery at [phone], Monday through Friday 6 am to 4 pm, or [place] at [facility] number at [phone].    Laparoscopic Tubal Ligation, Care After This sheet gives you information about how to care for yourself after your procedure. Your health care provider may also give you more specific instructions. If you have problems or questions, contact your health care provider. What can I expect after the procedure? After the procedure, it is common to have:  A sore throat.  Discomfort in your shoulder.  Mild discomfort or cramping in your abdomen.  Gas pains.  Pain or soreness in the area where the surgical incision was made.  A bloated feeling.  Tiredness.  Nausea.  Vomiting. Follow these instructions at home: Medicines  Take over-the-counter and prescription medicines only as told by your health care provider.  Do not take aspirin because it can cause bleeding.  Ask your health care provider if the medicine prescribed to you: ? Requires you to avoid driving or using heavy machinery. ? Can cause constipation. You may need to take actions to prevent or treat constipation,  such as:  Drink enough fluid to keep your urine pale yellow.  Take over-the-counter or prescription medicines.  Eat foods that are high in fiber, such as beans, whole grains, and fresh fruits and vegetables.  Limit foods that are high in fat and processed sugars, such as fried or sweet foods. Incision care      Follow instructions from your health care provider about how to take care of your incision. Make sure you: ? Wash your hands with soap and water before and after you change your bandage (dressing). If soap and water are not available, use hand sanitizer. ? Change your dressing as told by your health care provider. ? Leave stitches (sutures), skin glue, or adhesive strips in place. These skin closures may need to stay in place for 2 weeks or longer. If adhesive strip edges start to loosen and curl up, you may trim the loose edges. Do not remove adhesive strips completely unless your health care provider tells you to do that.  Check your incision area every day for signs of infection. Check for: ? Redness, swelling, or pain. ? Fluid or blood. ? Warmth. ? Pus or a bad smell. Activity  Rest as told by your health care provider.  Avoid sitting for a long time without moving. Get up to take short walks every 1-2 hours. This is important to improve blood flow and breathing. Ask for help if you feel weak or unsteady.  Return to your normal activities as told by your health care  provider. Ask your health care provider what activities are safe for you. General instructions  Do not take baths, swim, or use a hot tub until your health care provider approves. Ask your health care provider if you may take showers. You may only be allowed to take sponge baths.  Have someone help you with your daily household tasks for the first few days.  Keep all follow-up visits as told by your health care provider. This is important. Contact a health care provider if:  You have redness, swelling,  or pain around your incision.  Your incision feels warm to the touch.  You have pus or a bad smell coming from your incision.  The edges of your incision break open after the sutures have been removed.  Your pain does not improve after 2-3 days.  You have a rash.  You repeatedly become dizzy or light-headed.  Your pain medicine is not helping. Get help right away if you:  Have a fever.  Faint.  Have increasing pain in your abdomen.  Have severe pain in one or both of your shoulders.  Have fluid or blood coming from your sutures or from your vagina.  Have shortness of breath or difficulty breathing.  Have chest pain or leg pain.  Have ongoing nausea, vomiting, or diarrhea. Summary  After the procedure, it is common to have mild discomfort or cramping in your abdomen.  Take over-the-counter and prescription medicines only as told by your health care provider.  Watch for symptoms that should prompt you to call your health care provider.  Keep all follow-up visits as told by your health care provider. This is important. This information is not intended to replace advice given to you by your health care provider. Make sure you discuss any questions you have with your health care provider. Document Revised: 08/17/2018 Document Reviewed: 02/02/2018 Elsevier Patient Education  2020 [employer].

## 2019-06-27 NOTE — Telephone Encounter (Signed)
The pharmacy called in and stated that they need a call back about the percocet the pt was put on, they have questions about the dose. Please advise

## 2019-06-27 NOTE — Anesthesia Postprocedure Evaluation (Signed)
Anesthesia Post Note  Patient: Yvonne Morrison  Procedure(s) Performed: LAPAROSCOPIC TUBAL LIGATION (Bilateral )  Patient location during evaluation: PACU Anesthesia Type: General Level of consciousness: awake and alert Pain management: pain level controlled Vital Signs Assessment: post-procedure vital signs reviewed and stable Respiratory status: spontaneous breathing, nonlabored ventilation, respiratory function stable and patient connected to nasal cannula oxygen Cardiovascular status: blood pressure returned to baseline and stable Postop Assessment: no apparent nausea or vomiting Anesthetic complications: no     Last Vitals:  Vitals:   06/27/19 1025 06/27/19 1128  BP:  104/81  Pulse:  80  Resp:  16  Temp: 36.6 C   SpO2:  100%    Last Pain:  Vitals:   06/27/19 1128  TempSrc:   PainSc: 3                  Martha Clan

## 2019-06-27 NOTE — Op Note (Signed)
Procedure(s): LAPAROSCOPIC TUBAL LIGATION (using Filshie Clips) Procedure Note  Yvonne Morrison female 24 y.o. 06/27/2019  Indications: The patient is a 24 y.o. [phone] female with multiparity, desiring permanent sterilization.    Pre-operative Diagnosis: Multiparity, desiring permanent sterilization  Post-operative Diagnosis: Same  Surgeon: [name], MD  Assistants:  Surgical scrub tech  Anesthesia: General endotracheal anesthesia  Findings: The uterus was sounded to 8 cm Normal uterus, tubes, and ovaries.  Procedure Details: The patient was seen in the Holding Room. The risks, benefits, complications, treatment options, and expected outcomes were discussed with the patient.  The patient concurred with the proposed plan, giving informed consent.  The site of surgery properly noted/marked. The patient was taken to the Operating Room, identified as Yvonne Morrison and the procedure verified as Procedure(s) (LRB): LAPAROSCOPIC TUBAL LIGATION (Bilateral).     TECHNIQUE:  The patient was taken to the operating room where general anesthesia was obtained without difficulty.  She was then placed in the dorsal lithotomy position and prepared and draped in sterile fashion.  After an adequate timeout was performed, a bivalved speculum was then placed in the patient's vagina, and the anterior lip of cervix grasped with the single-tooth tenaculum.  The Hulka clamp was placed for uterine manipulation. The speculum was removed from the vagina.  Attention was then turned to the patient's abdomen where a 11-mm skin incision was made in the umbilical fold.  Towel clips were then placed on either side of the incision to aid placement of the trocar. The Optiview 11-mm trocar and sleeve were then advanced without difficulty with the laparoscope under direct visualization into the abdomen.  The abdomen was then insufflated with carbon dioxide gas and adequate pneumoperitoneum was obtained.  A survey of the patient's  pelvis and abdomen revealed entirely normal anatomy.  The fallopian tubes were observed and found to be normal in appearance. The Filshie clip applicator was placed through the operative port, and a Filshie clip was placed on the right fallopian tube ,about 2 cm from the cornual attachment, with care given to incorporate the underlying mesosalpinx.  A similar process was carried out on the contralateral side allowing for bilateral tubal sterilization.   Good hemostasis was noted overall.  The instruments were then removed from the patient's abdomen and the fascial incision was repaired with 0 Vicryl, and the skin was closed 4-0 Vicryl. Dermabond was placed over the skin incision.  The skin was then injected with a total of 16 ml of 1% Lidocaine.  The uterine manipulator and the tenaculum were removed from the vagina without complications.   The patient tolerated the procedure well.  Sponge, lap, and needle counts were correct times two.  The patient was then taken to the recovery room awake, extubated and in stable condition.   Estimated Blood Loss:  Minimal (<5 ml)      Drains: straight catheterization prior to procedure with  30 ml of clear urine         Total IV Fluids: 1000 ml  Specimens: None         Implants: None         Complications:  None; patient tolerated the procedure well.         Disposition: PACU - hemodynamically stable.         Condition: stable   [name], MD [facility]

## 2019-06-27 NOTE — Anesthesia Preprocedure Evaluation (Signed)
Anesthesia Evaluation  Patient identified by MRN, date of birth, ID band Patient awake    Reviewed: Allergy & Precautions, H&P , NPO status , Patient's Chart, lab work & pertinent test results, reviewed documented beta blocker date and time   History of Anesthesia Complications Negative for: history of anesthetic complications  Airway Mallampati: I  TM Distance: >3 FB Neck ROM: full    Dental  (+) Dental Advidsory Given, Teeth Intact   Pulmonary neg pulmonary ROS,    Pulmonary exam normal breath sounds clear to auscultation       Cardiovascular Exercise Tolerance: Good negative cardio ROS Normal cardiovascular exam Rhythm:regular Rate:Normal     Neuro/Psych negative neurological ROS  negative psych ROS   GI/Hepatic negative GI ROS, Neg liver ROS,   Endo/Other  negative endocrine ROS  Renal/GU negative Renal ROS  negative genitourinary   Musculoskeletal   Abdominal   Peds  Hematology negative hematology ROS (+)   Anesthesia Other Findings Past Medical History: No date: Anxiety 12/02/2016: History of cholestasis during pregnancy No date: Maternal renal lithiasis, history of     Comment:  2015, during 1st pregnancy No date: Rectal bleeding   Reproductive/Obstetrics negative OB ROS                             Anesthesia Physical Anesthesia Plan  ASA: I  Anesthesia Plan: General   Post-op Pain Management:    Induction: Intravenous  PONV Risk Score and Plan: 3 and Ondansetron, Dexamethasone, Midazolam, Promethazine and Treatment may vary due to age or medical condition  Airway Management Planned: Oral ETT  Additional Equipment:   Intra-op Plan:   Post-operative Plan: Extubation in OR  Informed Consent: I have reviewed the patients History and Physical, chart, labs and discussed the procedure including the risks, benefits and alternatives for the proposed anesthesia with the  patient or authorized representative who has indicated his/her understanding and acceptance.     Dental Advisory Given  Plan Discussed with: Anesthesiologist, CRNA and Surgeon  Anesthesia Plan Comments:         Anesthesia Quick Evaluation

## 2019-06-27 NOTE — Transfer of Care (Signed)
Immediate Anesthesia Transfer of Care Note  Patient: Yvonne Morrison  Procedure(s) Performed: LAPAROSCOPIC TUBAL LIGATION (Bilateral )  Patient Location: PACU  Anesthesia Type:General  Level of Consciousness: awake and sedated  Airway & Oxygen Therapy: Patient Spontanous Breathing  Post-op Assessment: Report given to RN  Post vital signs: stable  Last Vitals:  Vitals Value Taken Time  BP 112/70 06/27/19 0902  Temp    Pulse 75 06/27/19 0907  Resp 17 06/27/19 0907  SpO2 100 % 06/27/19 0907  Vitals shown include unvalidated device data.  Last Pain:  Vitals:   06/27/19 0629  TempSrc: Tympanic  PainSc: 0-No pain         Complications: No apparent anesthesia complications

## 2019-06-27 NOTE — OR Nursing (Signed)
Patient denies need for pain medication at this time, desires discharge, awaiting arrival of husband for discharge.

## 2019-06-27 NOTE — H&P (Addendum)
GYNECOLOGY PREOPERATIVE HISTORY AND PHYSICAL  Subjective:  Yvonne Morrison is a 24 y.o. [phone] here for pre-operative examination.  She desires surgical management bilateral tubal ligation, desires permanent sterilization due to multiparity.   No significant preoperative concerns.  Denies complaints today.   Proposed surgery: Bilateral Tubal Ligation (Filshie Clips)   Pertinent Gynecological History: Menses: regular every month without intermenstrual spotting Contraception: Xulane patches weekly Last pap: normal Date: 05/25/2019   Past Medical History:  Diagnosis Date  . Anxiety   . History of cholestasis during pregnancy 12/02/2016  . Maternal renal lithiasis, history of    2015, during 1st pregnancy  . Rectal bleeding     Past Surgical History:  Procedure Laterality Date  . NO PAST SURGERIES      OB History  Gravida Para Term Preterm AB Living  3 3 1 2   3   SAB TAB Ectopic Multiple Live Births        0 3    # Outcome Date GA Lbr Len/2nd Weight Sex Delivery Anes PTL Lv  3 Term 02/01/18 [redacted]w[redacted]d / 00:05 3000 g M Vag-Spont None  LIV  2 Preterm 01/10/16 [redacted]w[redacted]d 02:18 / 00:44 2376 g M Vag-Spont None  LIV  1 Preterm 03/07/14 103w0d  1869 g F Vag-Spont   LIV    Obstetric Comments  History of cholestasis of pregnancy at 28 weeks  History of renal lithiasis  Patient denies any other pertinent gynecologic issues.  Family History  Problem Relation Age of Onset  . Diabetes Father   . Prostate cancer Father   . Cancer Neg Hx   . Heart disease Neg Hx     Social History   Socioeconomic History  . Marital status: Married    Spouse name: Not on file  . Number of children: Not on file  . Years of education: Not on file  . Highest education level: Not on file  Occupational History  . Not on file  Tobacco Use  . Smoking status: Never Smoker  . Smokeless tobacco: Never Used  Substance and Sexual Activity  . Alcohol use: Yes    Alcohol/week: 0.0 standard drinks    Comment:  occass  . Drug use: No  . Sexual activity: Yes    Partners: Male    Birth control/protection: Patch  Other Topics Concern  . Not on file  Social History Narrative  . Not on file   Social Determinants of Health   Financial Resource Strain:   . Difficulty of Paying Living Expenses:   Food Insecurity:   . Worried About Charity fundraiser in the Last Year:   . Arboriculturist in the Last Year:   Transportation Needs:   . Film/video editor (Medical):   Yvonne Morrison Kitchen Lack of Transportation (Non-Medical):   Physical Activity:   . Days of Exercise per Week:   . Minutes of Exercise per Session:   Stress:   . Feeling of Stress :   Social Connections:   . Frequency of Communication with Friends and Family:   . Frequency of Social Gatherings with Friends and Family:   . Attends Religious Services:   . Active Member of Clubs or Organizations:   . Attends Archivist Meetings:   Yvonne Morrison Kitchen Marital Status:   Intimate Partner Violence:   . Fear of Current or Ex-Partner:   . Emotionally Abused:   Yvonne Morrison Kitchen Physically Abused:   . Sexually Abused:     No current facility-administered medications on  file prior to encounter.   Current Outpatient Medications on File Prior to Encounter  Medication Sig Dispense Refill  . norelgestromin-ethinyl estradiol Marilu Favre) 150-35 MCG/24HR transdermal patch APPLY 1 PATCH TOPICALLY ONCE A WEEK (Patient taking differently: Place 1 patch onto the skin once a week. ) 3 patch 3  . Butalbital-APAP-Caffeine 50-325-40 MG capsule Take 1-2 capsules by mouth every 6 (six) hours as needed for headache. (Patient not taking: Reported on 06/27/2019) 30 capsule 3    No Known Allergies   Review of Systems Constitutional: No recent fever/chills/sweats Respiratory: No recent cough/bronchitis Cardiovascular: No chest pain Gastrointestinal: No recent nausea/vomiting/diarrhea Genitourinary: No UTI symptoms Hematologic/lymphatic:No history of coagulopathy or recent blood thinner use      Objective:   Blood pressure 118/76, pulse 84, temperature (!) 97.3 F (36.3 C), temperature source Tympanic, resp. rate 16, last menstrual period 06/27/2019, SpO2 99 %, currently breastfeeding. CONSTITUTIONAL: Well-developed, well-nourished female in no acute distress.  HENT:  Normocephalic, atraumatic, External right and left ear normal. Oropharynx is clear and moist EYES: Conjunctivae and EOM are normal. Pupils are equal, round, and reactive to light. No scleral icterus.  NECK: Normal range of motion, supple, no masses SKIN: Skin is warm and dry. No rash noted. Not diaphoretic. No erythema. No pallor. NEUROLOGIC: Alert and oriented to person, place, and time. Normal reflexes, muscle tone coordination. No cranial nerve deficit noted. PSYCHIATRIC: Normal mood and affect. Normal behavior. Normal judgment and thought content. CARDIOVASCULAR: Normal heart rate noted, regular rhythm RESPIRATORY: Effort and breath sounds normal, no problems with respiration noted ABDOMEN: Soft, nontender, nondistended. PELVIC: Deferred MUSCULOSKELETAL: Normal range of motion. No edema and no tenderness. 2+ distal pulses.    Labs: Results for orders placed or performed during the hospital encounter of 06/27/19 (from the past 336 hour(s))  Pregnancy, urine POC   Collection Time: 06/27/19  6:18 AM  Result Value Ref Range   Preg Test, Ur NEGATIVE NEGATIVE  CBC   Collection Time: 06/27/19  6:24 AM  Result Value Ref Range   WBC 6.4 4.0 - 10.5 K/uL   RBC 3.99 3.87 - 5.11 MIL/uL   Hemoglobin 10.6 (L) 12.0 - 15.0 g/dL   HCT 33.0 (L) 36.0 - 46.0 %   MCV 82.7 80.0 - 100.0 fL   MCH 26.6 26.0 - 34.0 pg   MCHC 32.1 30.0 - 36.0 g/dL   RDW 14.0 11.5 - 15.5 %   Platelets 281 150 - 400 K/uL   nRBC 0.0 0.0 - 0.2 %  Results for orders placed or performed during the hospital encounter of 06/23/19 (from the past 336 hour(s))  SARS CORONAVIRUS 2 (TAT 6-24 HRS) Nasopharyngeal Nasopharyngeal Swab   Collection Time:  06/23/19  3:13 PM   Specimen: Nasopharyngeal Swab  Result Value Ref Range   SARS Coronavirus 2 NEGATIVE NEGATIVE     Imaging Studies: No results found.  Assessment:    No diagnosis found. Plan:   Patient desires permanent sterilization.  Other reversible forms of contraception were discussed with patient; she declines all other modalities. Risks of procedure discussed with patient including but not limited to: risk of regret, permanence of method, bleeding, infection, injury to surrounding organs and need for additional procedures.  Failure risk of about 1% with increased risk of ectopic gestation if pregnancy occurs was also discussed with patient.  Also discussed possibility of post-tubal pain syndrome. Patient verbalized understanding of these risks and wants to proceed with sterilization.  Written informed consent obtained. Medicaid sterilization consent form is now  compliant.  Preop testing reviewed, normal. Patient has been NPO since midnight.    [name], MD [facility]

## 2019-06-28 NOTE — Telephone Encounter (Signed)
Spoke to the pharmacy concerning the call to the office. pharmacy stated that it was about the colace and that her insurance would not cover the medication and pt was advised to purchase it over the counter.

## 2019-07-05 ENCOUNTER — Ambulatory Visit (INDEPENDENT_AMBULATORY_CARE_PROVIDER_SITE_OTHER): Payer: Medicaid Other | Admitting: Obstetrics and Gynecology

## 2019-07-05 ENCOUNTER — Encounter: Payer: Self-pay | Admitting: Obstetrics and Gynecology

## 2019-07-05 ENCOUNTER — Other Ambulatory Visit: Payer: Self-pay

## 2019-07-05 ENCOUNTER — Encounter: Payer: Medicaid Other | Admitting: Obstetrics and Gynecology

## 2019-07-05 VITALS — BP 112/72 | HR 87 | Ht 66.0 in | Wt 168.8 lb

## 2019-07-05 DIAGNOSIS — R310 Gross hematuria: Secondary | ICD-10-CM | POA: Diagnosis not present

## 2019-07-05 DIAGNOSIS — Z09 Encounter for follow-up examination after completed treatment for conditions other than malignant neoplasm: Secondary | ICD-10-CM

## 2019-07-05 DIAGNOSIS — Z9851 Tubal ligation status: Secondary | ICD-10-CM

## 2019-07-05 LAB — POCT URINALYSIS DIPSTICK
Bilirubin, UA: NEGATIVE
Glucose, UA: NEGATIVE
Ketones, UA: NEGATIVE
Nitrite, UA: NEGATIVE
Protein, UA: POSITIVE — AB
Spec Grav, UA: >=1.03 — AB (ref 1.010–1.025)
Urobilinogen, UA: 0.2 E.U./dL
pH, UA: 6.5 (ref 5.0–8.0)

## 2019-07-05 NOTE — Progress Notes (Signed)
Pt present for follow for incision check after tubal surgery.  Pt c/o left side incision tenderness, blood in urine with clots and cramping. UA completed.

## 2019-07-05 NOTE — Patient Instructions (Signed)
Hematuria, Adult Hematuria is blood in the urine. Blood may be visible in the urine, or it may be identified with a test. This condition can be caused by infections of the bladder, urethra, kidney, or prostate. Other possible causes include:  Kidney stones.  Cancer of the urinary tract.  Too much calcium in the urine.  Conditions that are passed from parent to child (inherited conditions).  Exercise that requires a lot of energy. Infections can usually be treated with medicine, and a kidney stone usually will pass through your urine. If neither of these is the cause of your hematuria, more tests may be needed to identify the cause of your symptoms. It is very important to tell your health care provider about any blood in your urine, even if it is painless or the blood stops without treatment. Blood in the urine, when it happens and then stops and then happens again, can be a symptom of a very serious condition, including cancer. There is no pain in the initial stages of many urinary cancers. Follow these instructions at home: Medicines  Take over-the-counter and prescription medicines only as told by your health care provider.  If you were prescribed an antibiotic medicine, take it as told by your health care provider. Do not stop taking the antibiotic even if you start to feel better. Eating and drinking  Drink enough fluid to keep your urine clear or pale yellow. It is recommended that you drink 3-4 quarts (2.8-3.8 L) a day. If you have been diagnosed with an infection, it is recommended that you drink cranberry juice in addition to large amounts of water.  Avoid caffeine, tea, and carbonated beverages. These tend to irritate the bladder.  Avoid alcohol because it may irritate the prostate (men). General instructions  If you have been diagnosed with a kidney stone, follow your health care provider's instructions about straining your urine to catch the stone.  Empty your bladder  often. Avoid holding urine for long periods of time.  If you are female: ? After a bowel movement, wipe from front to back and use each piece of toilet paper only once. ? Empty your bladder before and after sex.  Pay attention to any changes in your symptoms. Tell your health care provider about any changes or any new symptoms.  It is your responsibility to get your test results. Ask your health care provider, or the department performing the test, when your results will be ready.  Keep all follow-up visits as told by your health care provider. This is important. Contact a health care provider if:  You develop back pain.  You have a fever.  You have nausea or vomiting.  Your symptoms do not improve after 3 days.  Your symptoms get worse. Get help right away if:  You develop severe vomiting and are unable take medicine without vomiting.  You develop severe pain in your back or abdomen even though you are taking medicine.  You pass a large amount of blood in your urine.  You pass blood clots in your urine.  You feel very weak or like you might faint.  You faint. Summary  Hematuria is blood in the urine. It has many possible causes.  It is very important that you tell your health care provider about any blood in your urine, even if it is painless or the blood stops without treatment.  Take over-the-counter and prescription medicines only as told by your health care provider.  Drink enough fluid to keep   your urine clear or pale yellow. This information is not intended to replace advice given to you by your health care provider. Make sure you discuss any questions you have with your health care provider. Document Revised: 08/04/2018 Document Reviewed: 04/12/2016 [employer]vier Patient Education  2020 [employer]  

## 2019-07-05 NOTE — Progress Notes (Signed)
OBSTETRICS/GYNECOLOGY POST-OPERATIVE CLINIC VISIT  Subjective:     Yvonne Morrison is a 24 y.o. female who presents to the clinic 1 weeks status post laparoscopic bilateral tubal ligation (filshie clips) for requested sterilization. Eating a regular diet without difficulty. Bowel movements are normal. The patient is not having any pain.   Of note, patient reports that beginning this past Saturday, she began noting blood in her urine, and also feels twinges of pain with urination (feels more like a pinch).  She states that this has happened to her once before, when she had a catheter placed during her labor with her first baby. Episode lasted for ~ 1 month then self-resolved. She does have a prior h/o kidney stones.  No other family history of bladder issues. Denies any flank pain, fevers/chills.    The following portions of the patient's history were reviewed and updated as appropriate: allergies, current medications, past family history, past medical history, past social history, past surgical history and problem list.  Review of Systems Pertinent items noted in HPI and remainder of comprehensive ROS otherwise negative.    Objective:    BP 112/72   Pulse 87   Ht 5\' 6"  (1.676 m)   Wt 168 lb 12.8 oz (76.6 kg)   LMP 06/27/2019   BMI 27.25 kg/m  General:  alert and no distress  Abdomen: soft, bowel sounds active, non-tender. No suprapubic tenderness.   Incision:   healing well, no drainage, no erythema, no hernia, no seroma, no swelling, no dehiscence, incision well approximated.   Pelvis:  external genitalia normal, rectovaginal septum normal.  Urethra appears normal, no blood noted. Vagina with small amount of thin white discharge, no blood present.  Cervix normal appearing, no lesions and no motion tenderness. Bimanual exam not performed.    Pathology:  None   Labs:  Results for orders placed or performed in visit on 07/05/19  POCT urinalysis dipstick  Result Value Ref Range    Color, UA auburn    Clarity, UA cloudy    Glucose, UA Negative Negative   Bilirubin, UA neg    Ketones, UA neg    Spec Grav, UA >=1.030 (A) 1.010 - 1.025   Blood, UA 3+ large    pH, UA 6.5 5.0 - 8.0   Protein, UA Positive (A) Negative   Urobilinogen, UA 0.2 0.2 or 1.0 E.U./dL   Nitrite, UA neg    Leukocytes, UA Moderate (2+) (A) Negative   Appearance auburn;cloudy    Odor      Assessment:   Postop check S/P tubal ligation Gross hematuria - Plan: POCT urinalysis dipstick, Urine Culture   Plan:   1. Wound care discussed. Advised that if Dermabond dressing does not begin peeling after next week, can use hydrogen peroxide or alcohol pad to remove.  2. Operative findings reviewed, normal.  3. Activity restrictions: none 4. Anticipated return to work: not applicable. 5. Patient with gross hematuria noted since Saturday.  Blood also noted in UA today, confirmed to be from the bladder as no blood noted in vaginal vault.  Informed patient that an "in-and-out" catheterization was performed at the time of surgery with no obvious trauma occurring, but in light of patient's h/o kidney stones and previous hematuria incident after bladder use, it is possible that even short use of the catheter could cause some disturbance of the bladder mucosa.  Will order urine culture to rule out infectious case (although nitrites negative today). Advised on aggressive hydration and use  of Azo as needed for "twinges of pain" with urination.  6. Follow up: 1 month, as needed if hematuria does not resolve.    [name], MD [facility]

## 2020-03-09 IMAGING — DX LEFT FOOT - COMPLETE 3+ VIEW
3 series · 3 of 3 positions shown · non-contrast
Comparison: None.

CLINICAL DATA: Fall with twisting left ankle injury today.

EXAM:
LEFT FOOT - COMPLETE 3+ VIEW

[foot ap]
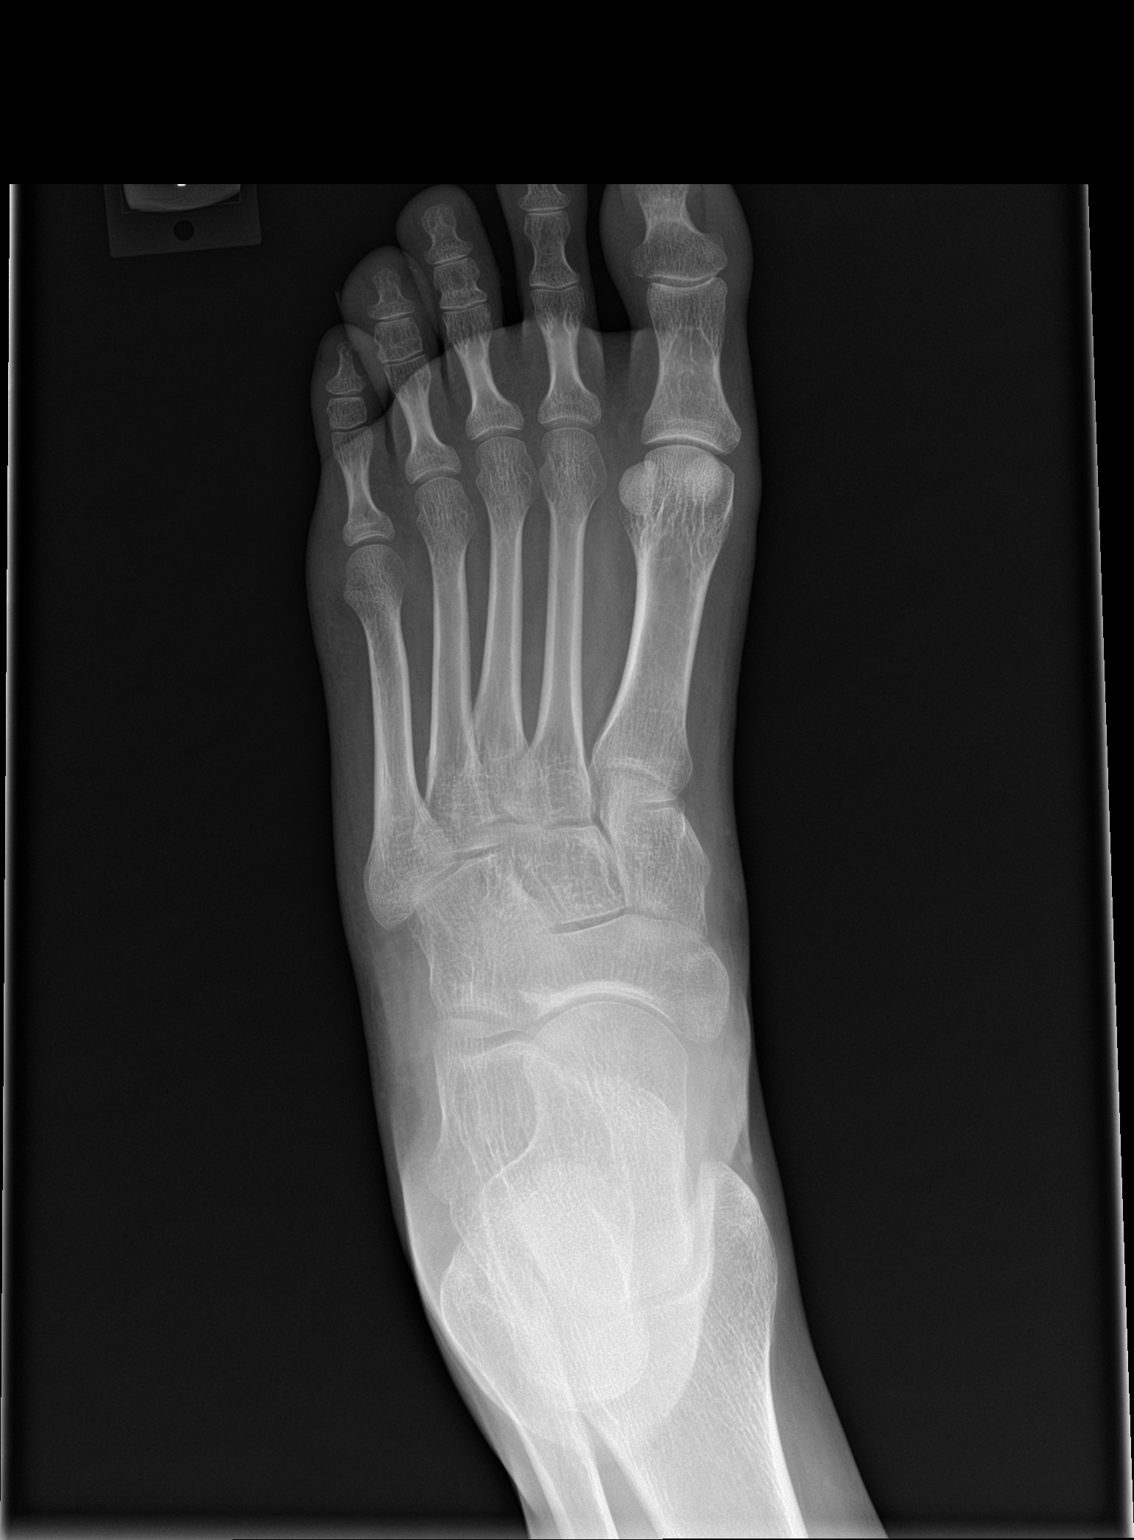

[foot obl]
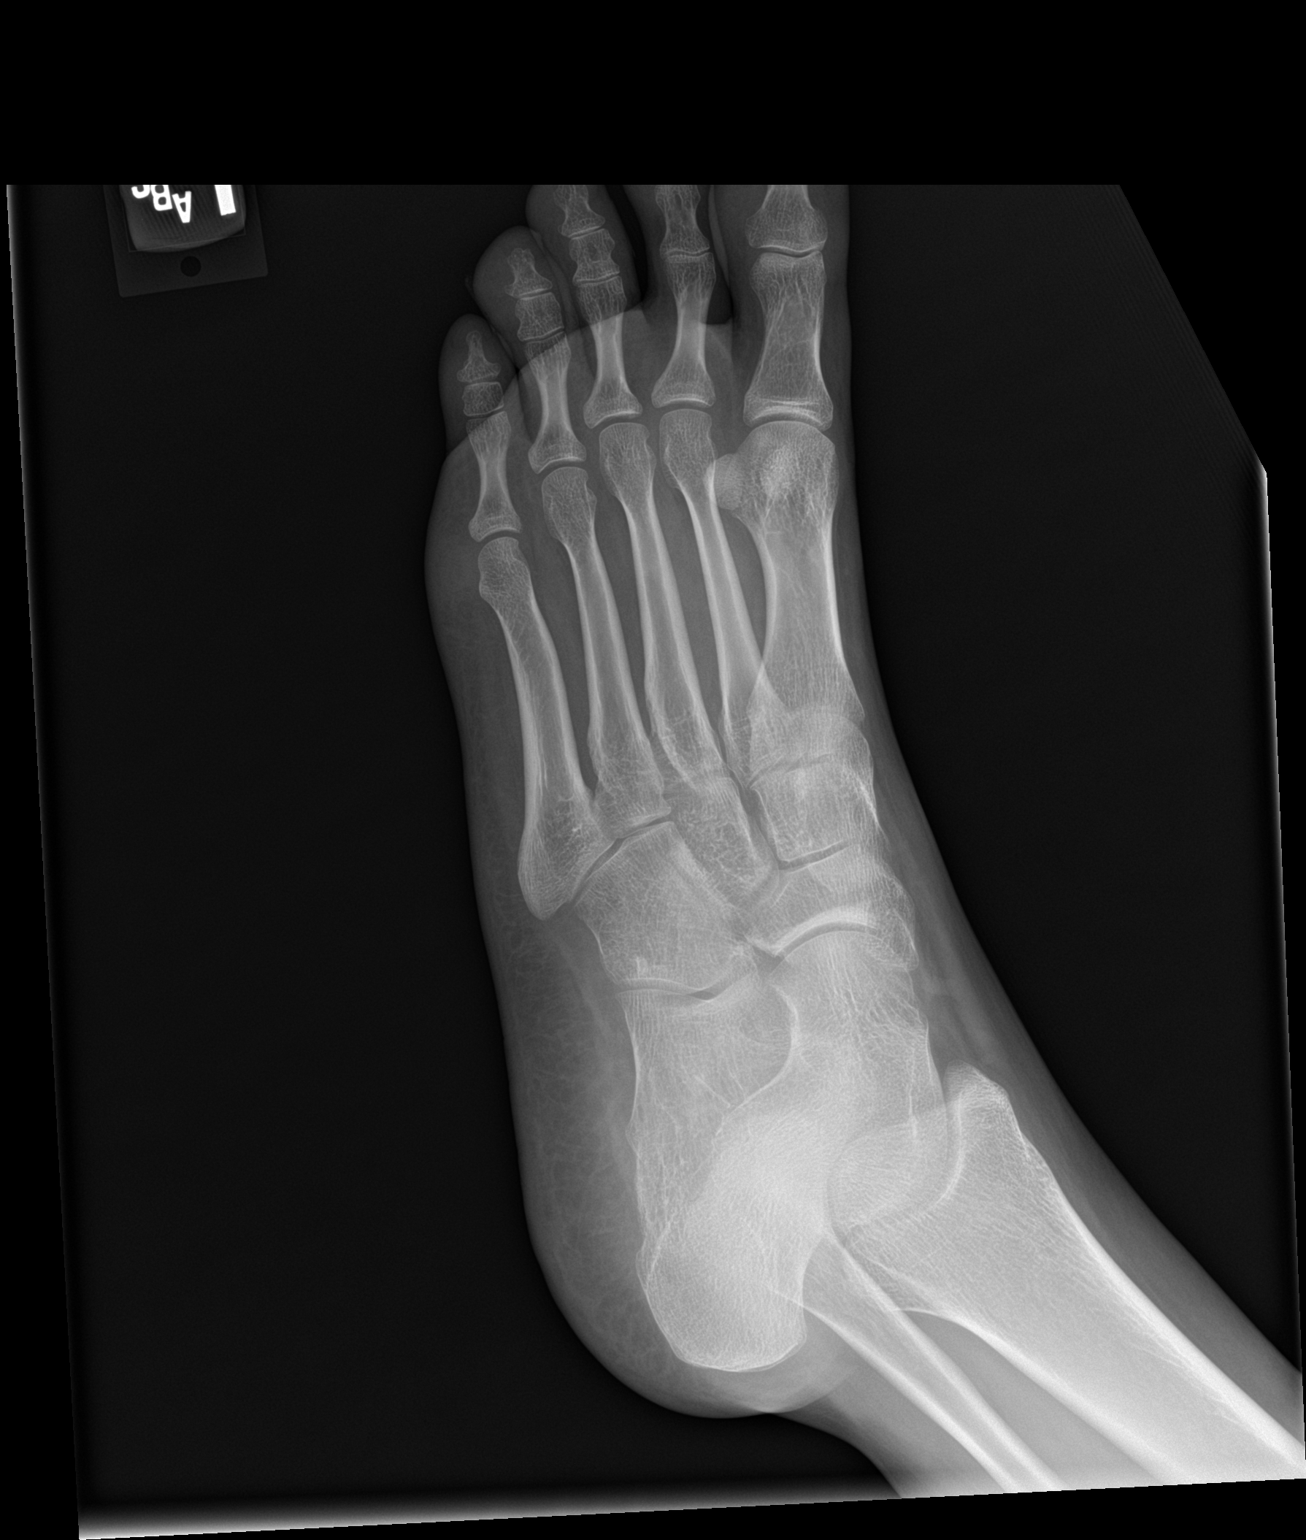

[foot lat]
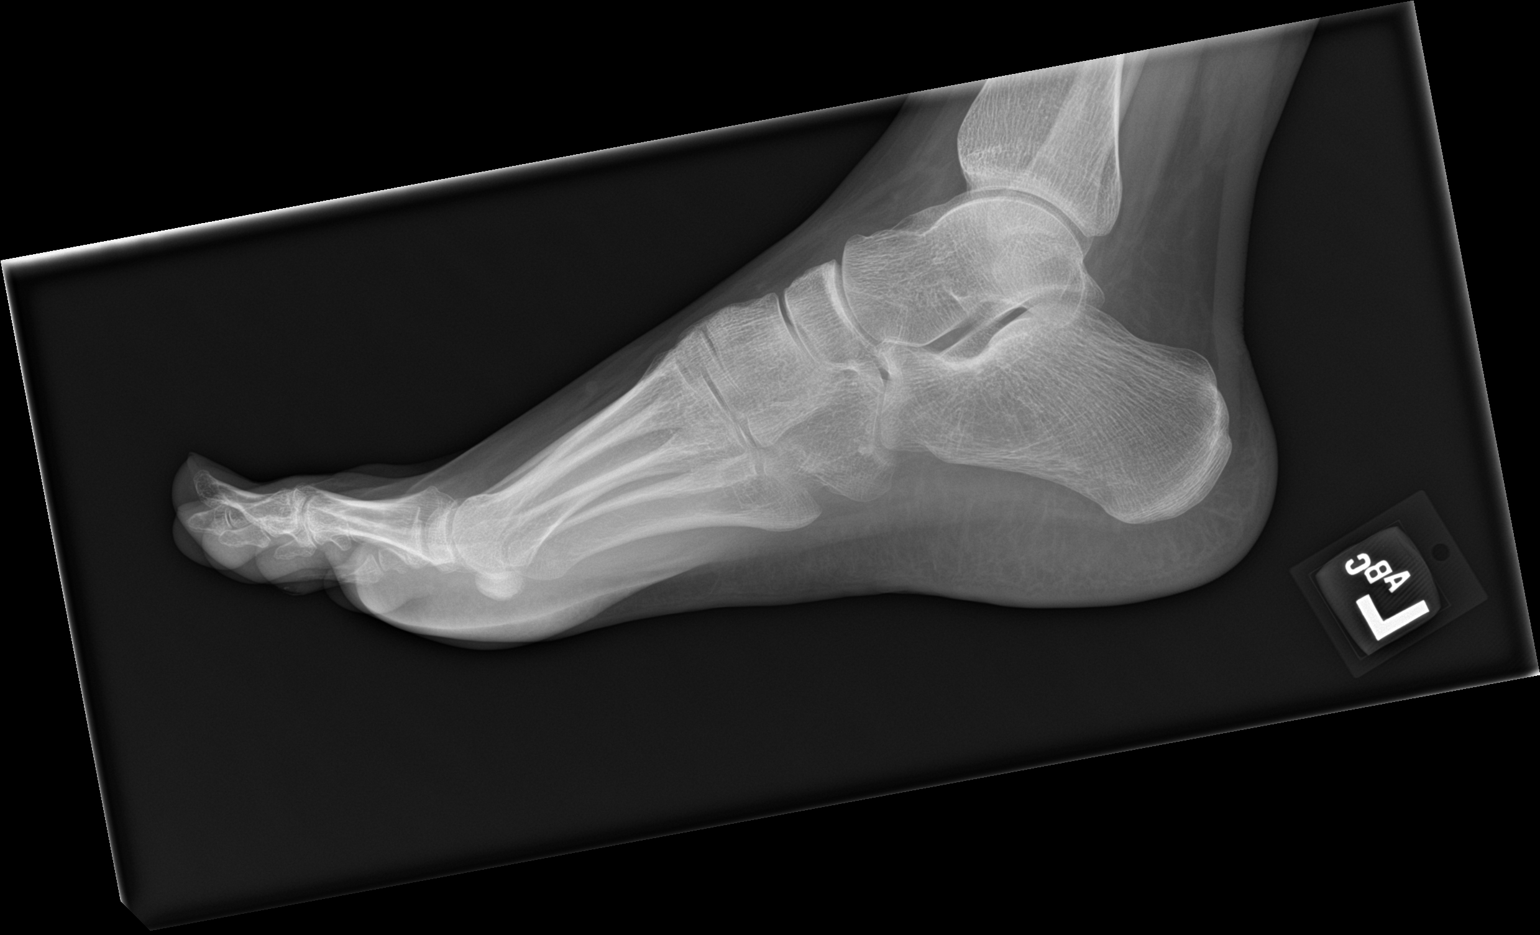

[3 of 3 positions shown; findings below may reference images not displayed]

FINDINGS: There is no evidence of fracture or dislocation. There is no
evidence of arthropathy or other focal bone abnormality. Soft
tissues are unremarkable.
IMPRESSION: Negative.
